# Patient Record
Sex: Male | Born: 1940 | Race: Black or African American | Hispanic: No | Marital: Married
Health system: Southern US, Community
[De-identification: ages and names within clinical notes are randomized; demographics above are authoritative.]

## PROBLEM LIST (undated history)

## (undated) DIAGNOSIS — I255 Ischemic cardiomyopathy: Secondary | ICD-10-CM

## (undated) DIAGNOSIS — I739 Peripheral vascular disease, unspecified: Secondary | ICD-10-CM

## (undated) DIAGNOSIS — I509 Heart failure, unspecified: Secondary | ICD-10-CM

## (undated) DIAGNOSIS — T8743 Infection of amputation stump, right lower extremity: Secondary | ICD-10-CM

## (undated) DIAGNOSIS — N184 Chronic kidney disease, stage 4 (severe): Secondary | ICD-10-CM

## (undated) DIAGNOSIS — E119 Type 2 diabetes mellitus without complications: Secondary | ICD-10-CM

---

## 2002-11-21 ENCOUNTER — Ambulatory Visit: Payer: Self-pay

## 2014-03-13 ENCOUNTER — Encounter (HOSPITAL_BASED_OUTPATIENT_CLINIC_OR_DEPARTMENT_OTHER): Payer: Self-pay | Admitting: Physical Medicine & Rehabilitation

## 2014-03-13 ENCOUNTER — Ambulatory Visit (HOSPITAL_BASED_OUTPATIENT_CLINIC_OR_DEPARTMENT_OTHER): Payer: Medicare Other | Attending: Physical Medicine & Rehabilitation | Admitting: Physical Medicine & Rehabilitation

## 2014-03-13 VITALS — BP 166/90 | HR 71 | Temp 98.4°F | Resp 16 | Ht 74.0 in | Wt 205.0 lb

## 2014-03-13 DIAGNOSIS — Z89521 Acquired absence of right knee: Secondary | ICD-10-CM | POA: Insufficient documentation

## 2014-03-13 DIAGNOSIS — Z89519 Acquired absence of unspecified leg below knee: Secondary | ICD-10-CM | POA: Insufficient documentation

## 2014-03-13 DIAGNOSIS — Z89511 Acquired absence of right leg below knee: Secondary | ICD-10-CM

## 2014-03-13 DIAGNOSIS — I509 Heart failure, unspecified: Secondary | ICD-10-CM | POA: Insufficient documentation

## 2014-03-13 DIAGNOSIS — E119 Type 2 diabetes mellitus without complications: Secondary | ICD-10-CM | POA: Insufficient documentation

## 2014-03-13 DIAGNOSIS — N189 Chronic kidney disease, unspecified: Secondary | ICD-10-CM | POA: Insufficient documentation

## 2014-03-13 NOTE — Progress Notes (Signed)
ID: Michael Mora is a 73 year old year old male with R BKA since 08/2012, who presents for initial evaluation for amputation rehabilitation at the request of Dr. Silvio Clayman.    PCP: Dr. Silvio Clayman    CC: left hip pain, R BKA, poorly fitting prosthesis    History of Present Illness:  Michael Mora is a pleasant 73 y.o. Male who presents today with his son to discuss prosthetic options. 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 He states that he had a prosthetic leg since last year but it has never fit well. He was seeing [facility] in [place] and in another location but is not pleased with the results. He feels that his functional mobility would increase with a better fitting prosthesis. He feels that his current leg is too loose and that the foot moves too much. He feels quite unstable and rarely ambulates. He wears his prosthesis all day but mostly it just enables him to transfer from a couch or bed to his wheelchair. He denies residual limb or phantom limb pain. Of note, he has a history of DM and CHF and has open heart/valve surgery planned in the upcoming months. He reports that he often has dyspnea on exertion with minimal ambulation around the house, or if he goes up/down stairs. He has a h/o diabetes and was seen by podiatry 2 weeks ago. He states that he had DM shoes ordered a long time ago but has not yet received them. He reports no current skin breakdown. ROS is negative for signs of infection including fever or chills. ROS is significant for poor vision and dyspnea on exertion.    Amputation history:  Side and level of amputation: R BKA  Date of amputation: around 08/2012  Cause of amputation: DM foot infection  Surgeon and facility where amputation performed: [facility] in [place]  Current prosthestist: none currently, was seeing [facility]  Description of current prosthesis: total contact below the knee socket with gel liner and  locking pin, exoskeleton pylon and dynamic response foot   Function prior to amputation: unlimited community ambulation without the use of assistive device  Current functional level: K2   Expected functional level with optimal prosthetic rehab: K3   Assistive devices used: spc and mwc     PMH:  DM  HTN  CAD  CHF  TIA  CKD  Cataract Surgery  Retina Surgery  BKA  PVD  RUE HD catheter  NSTEMI  Anemia    SH:  Lives with family in [place]. Ramp to enter home. BR, bathroom, kitchen, LR on first floor. Shower on second floor.    FH:  Family history of DM and CVA  Father with kidney disease    Meds/All/FH reviewed and updated in EPIC as appropriate     ROS: complete review of systems negative except as mentioned in the history above    OBJECTIVE:  VS: BP 166/90 mmHg  Pulse 71  Temp(Src) 98.4 F (36.9 C) (Temporal)  Resp 16  Ht 6' 2"  (1.88 m)  Wt 205 lb (92.987 kg)  BMI 26.31 kg/m2  GEN: Wt 205 lb (92.987 kg)      ROM: no hip or knee flexion contractures  Strength: strength intact in residual limb and intact limb  Residual limb:  Length: moderate  Shape: cylindrical  Skin: intact without rashes, ulcers or signs of infection  Intact foot:  The intact  foot has diminished sensation to light touch and absent to LT in toes, + pitting edema to mid shin, non-palpable dorsalis pedis pulse, dried skin between toes and 1cm callous on lateral edge of small toe.  Gait: slow cadence, unsteady, adducted swing with excessive internal rotation.    ASSESSMENT:      Mr. Villard is a 73 year old year old male with sub-optimal functional mobility after R BKA in 08/2012 who presents for initial evaluation for amputation rehabilitation.      Prosthetic plan:  The patient has the potential to be a K3 level ambulator with a well fitting and appropriate prosthesis.      Replacement socket: The patient requires a new socket because their current socket is no longer fitting due to volume change. The current socket has been maximally adjusted and  the patient is at high risk for skin breakdown and pain due to a poorly fitting socket.  This could lead to a decrease in mobility and independence with ADLs.  The patient met with one of the prosthetists today and has a follow up appointment scheduled in their clinic to be further fitted for a replacement socket and foot.    Replacement foot:  The patient requires a replacement foot as the current foot has too much motion and is unstable, placing him at risk for falls.  We recommend that they have a restricted motion foot to improve ambulation and increase function.      Physical Therapy Plan: The patient has never been seen by PT since amputation. We provided referral for PT which can be performed by a knowledgeable amputee therapist. We recommend [facility] in [place] given his home location.    Diabetes Plan:  The patient was counseled about routine foot care for their intact foot.  They were instructed to follow up with their podiatrist every 3 months or as directed by the podiatrist.  The patient will be referred to [facility] for diabetic shoe with custom insert.     Other: We have counseled the patient about the available services for amputees at [facility] including the amputee support group, rehab psychology services and vocational services.  We have discussed the high risk of falls after amputation and fall prevention strategies. He is also interested in power mobility- we provided him with a referral to our PT wheelchair clinic.    We have answered all of the patients questions and the patient agrees to the plan as described above.  We have provided the patient with information on how to contact the clinic for follow up appointments as needed.       Arna Snipe, DO  PGY-3 Rehab Medicine

## 2014-03-13 NOTE — Patient Instructions (Addendum)
Thank you for coming to [facility] today. We appreciate the time you spent with us today.     Our goal is to provide easy to understand instructions.    Reminders from your visit today:      1. Follow up with [facility] for adjustments to your prosthesis    2. Referral provided for [facility]. Please call them to schedule an appointment for prosthetic gait training.    3. Follow up with [facility] wheelchair clinic for wheelchair/power mobility assessment.      Have other questions? - Just let us know, we'd be happy to help. Please feel free to call us at:   [phone] or use the "email" feature in eCare/myChart.    We want your care experience to be excellent every time - if you receive a survey in the mail please fill it out and mail it back.    Dr. Eduard RouxFriedly appreciates the opportunity to work with you and your primary care provider to maximize your function and improve your health.

## 2014-03-13 NOTE — Progress Notes (Signed)
Call to [facility].  Request Medication list be faxed over.

## 2014-03-20 NOTE — Progress Notes (Signed)
I saw and evaluated the patient. I have reviewed the resident's documentation and agree with it.

## 2014-03-22 ENCOUNTER — Ambulatory Visit (HOSPITAL_BASED_OUTPATIENT_CLINIC_OR_DEPARTMENT_OTHER)
Payer: Medicare Other | Attending: Physical Medicine & Rehabilitation | Admitting: Rehabilitative and Restorative Service Providers"

## 2014-03-22 VITALS — BP 137/69 | HR 79

## 2014-03-22 DIAGNOSIS — N189 Chronic kidney disease, unspecified: Secondary | ICD-10-CM | POA: Insufficient documentation

## 2014-03-22 DIAGNOSIS — Z89511 Acquired absence of right leg below knee: Secondary | ICD-10-CM

## 2014-03-22 DIAGNOSIS — I509 Heart failure, unspecified: Secondary | ICD-10-CM | POA: Insufficient documentation

## 2014-03-22 DIAGNOSIS — E1159 Type 2 diabetes mellitus with other circulatory complications: Secondary | ICD-10-CM | POA: Insufficient documentation

## 2014-03-22 DIAGNOSIS — Z89521 Acquired absence of right knee: Secondary | ICD-10-CM | POA: Insufficient documentation

## 2014-03-22 DIAGNOSIS — Z7409 Other reduced mobility: Secondary | ICD-10-CM | POA: Insufficient documentation

## 2014-03-22 NOTE — Progress Notes (Signed)
[facility] INITIAL PLAN OF CARE          MOBILITY EQUIPMENT    __X_ Certification      From:  03/22/2014            Through:   03/22/2014  _X__ Discontinue Therapy Services    ONSET DATE:  08/2012   START OF CARE DATE:  03/22/2014  VISITS FROM SOC: 1  TOTAL DSHS UNITS TO DATE: 0  TREATMENT DIAGNOSIS: mobility impairment, balance impairment, decreased endurance    Referring Provider: Cindy Hazy, MD    Interpreter Status:  none  Cultural Practices that Influence Care: none  History of Present Illness: Increased difficulty with walking due to shortness of breath. Side and level of amputation: R BKA  Date of amputation: around 08/2012  Cause of amputation: DM foot infection  Surgeon and facility where amputation performed: [facility] in [place]  Current prosthestist: none currently, was seeing [facility]  Description of current prosthesis: total contact below the knee socket with gel liner and locking pin, exoskeleton pylon and dynamic response foot   Function prior to amputation: unlimited community ambulation without the use of assistive device  Current functional level: K2  Expected functional level with optimal prosthetic rehab: K3  Assistive devices used: spc and mwc  Pertinent Medical/Surgical History:    DM  HTN  CAD  CHF  TIA  CKD  Cataract Surgery  Retina Surgery  BKA  PVD  RUE HD catheter  NSTEMI  Anemia  L dislocated shoulder in the 1960's  B shoulder pain  Social History: Living independently in a house in [place] with one flight of stairs (railing on R going up) to get to the bathroom. Pt's son is around frequently to help him with groceries, ADL's, and pt's girlfriend is around frequently to help him get in and out of the bathtub for showers, during which he doffs his prosthetic and sits on a stool in the shower. Pt reports he is independent with all other ADL's and IADL's. 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  Prior Level of Function: Before onset of the current condition, the patient was able to walk independently    Current / Past Rehabilitation: SNF rehab immediately following receiving his prosthetic in the spring of 2014. Reports in home PT previously this year.  But not much focus on gait training  Precautions: R UE AV shunt for access for dialysis     SUBJECTIVE:  Patient's Statement: Pt reports that he gets SOB and cannot move around much anymore, because of his heart problems, and his prosthetic foot feeling like it is loose. Pt also c/o L stabbing hip pain during walking. Reports he can stand for about 10-15 minutes before having to sit down, and he gets really tired and out of breath entering and leaving the house to get to his truck.    Patient Stated Goals: To obtain a power wheelchair, increase endurance, and return to work.    OBJECTIVE:  Cognition: appropriate    Strength:   UE: 5/5 except shoulder flexion, abduction, ER all 3+/5   LE: 4/5 except hip flexion 3+/5    ROM:    UE: wnl except shoulder flexion 50% limited on L, 60% limited on R, shoulder abduction 75% limited B.  LE: wnl      Shoulder special tests:   Rotator  cuff: pos B   Impingement: Hawkins-Kennedy test Positive on L     Neer test Positive B     Shoulder external rotation 3+/5 minimal pain     Reverse Impingement test Positive on R    Posture: poor, slumped sacral sitting, forward head, forward shoulders, protracted and anteriorly tilted scapulae.    Transfers: sit to stand and stand to sit independent but guarded and slow     Ambulation: Walking over level with SPC in L UE, for 100 feet, displays L lateral trunk lean, R hip circumduction, wide BOS    Timed Up and Go: 24.47 seconds   Increased risk of falls with 13.5 seconds or more    Endurance: limited to walking up to 100 feet due to L hip pain and shortness of breath, standing 10-15 minutes due to pain/fatigue.    Wheelchair Mobility:    Manual wheelchair: Pt propelled a manual wheelchair with both hands, and feet if his arms get tired, and was able to sustain manual wheelchair propulsion for household level distances.    Power scooter:  A scooter is not appropriate for this patient due to his ability to propel a manual wheelchair.    Power wheelchair: A power wheelchair is not appropriate for this patient due to his ability to propel a manual wheelchair household distances independently.    CURRENT EQUIPMENT:   Wheelchair:  Left at home  [place] wheeled and four wheeled    10 Meter Walking Test:  Date   Self-paced:  03/22/14   19.46 sec = 33.3 meters/min = 43% of normal for 8 - 19 year old age group = least limited household ambulator    Fast-paced:   03/22/14 13.69 sec = 50 meters/ min = 52% of normal for 55 - 84 year old age group = least limited household ambulator       INTERVENTION:  Initial Evaluation x  60 Minutes    Time In: 1105  Total Intervention Minutes: 60  Total Timed Code Minutes: 0      Education:    Biomedical engineer: patient  Preferred learning style:  demonstration and verbal  Barriers to Learning: none  Topic Taught:  Evaluation results, importance of follow up with physician, prosthetist, and wheel chair company for any repairs that may be needed.  Response to Education: Patient and his son stated understanding      ASSESSMENT: Mr. Michael Mora presents for a physical therapy evaluation to determine appropriate mobility equipment needs.  The patient presents with impairments in strength, range of motion, endurance, balance, and pain.  Due to these impairments the patient's overall functional mobility is limited. Due to shortness of breath he is not able to independently or safely walk consistently in home with a walker or cane.  He is able to propel a manual wheelchair household distances independently, and therefore does not qualify for a power chair. Pt will seek further physical therapy services for improving  endurance and strength closer to his home in [place]. Pt instructed to keep in touch with his PCP, prosthetist, and wheelchair company regarding his heart issues, prosthetic issues, and w/c maintenance, respectively. Pt will be discharged today.    FALL RISK    yes   ?  Not a risk based on screening   ?   Low risk based on screening  X   High risk based on evaluation    Rehabilitation potential is: Good  Potential Barriers to achieve rehab goals:  none    Outcome Measure/Primary Functional Assessment Tool for G Coding:   10 meter walk test       Functional Limitation Medicare G CODE Medicare Modifier   Current Status  Mobility:  Walking  & Moving Around [id number]    CK   Projected Status Mobility:  Walking  & Moving Around   B2010 CK   Discharge Status Mobility: Walking & Moving Around [id number] CK       EQUIPMENT RECOMMENDATION:    Continue to use his manual wheelchair, and call the wheelchair company and or the nursing facility where he obtained the chair, in order to get necessary repairs to the chair. A power chair is not recommended due to his ability to propel his manual wheelchair in home distances, as well as his ability to ambulate inside the home.    GOALS:    Discharge Goals Progress Toward Discharge Goals/  Current Level of Function    03/22/2014   Appropriate wheelchair determined to meet patients' postural needs and mobility needs to maximize independence.  Met       The above goals and plan of care have been discussed and agreed upon by the patient.      PLAN:   Patient will be discharged from physical therapy at this time.  Pt to follow up with PT clinic in [place] to address endurance and gait limitations.    Danella Penton, SPT  Physical Therapy Intern  [facility]    I have reviewed the documentation of this visit and I confirm that I was present for the entire session and not engaged in other tasks.  I directed / guided the services and am responsible for the assessment and treatment of this  patient in this visit.      Kandee Keen, PT  Physical Therapist  [facility] [phone])  Department of Rehabilitation Medicine

## 2014-06-12 ENCOUNTER — Encounter (HOSPITAL_BASED_OUTPATIENT_CLINIC_OR_DEPARTMENT_OTHER): Payer: Medicare Other | Admitting: Physical Medicine & Rehabilitation

## 2015-10-02 ENCOUNTER — Inpatient Hospital Stay
Admission: AD | Admit: 2015-10-02 | Discharge: 2015-12-07 | Disposition: E | Payer: Self-pay | Source: Ambulatory Visit | Attending: Internal Medicine | Admitting: Internal Medicine

## 2015-10-02 ENCOUNTER — Institutional Professional Consult (permissible substitution) (HOSPITAL_COMMUNITY): Payer: Self-pay

## 2015-10-02 DIAGNOSIS — Z931 Gastrostomy status: Secondary | ICD-10-CM

## 2015-10-02 DIAGNOSIS — Z431 Encounter for attention to gastrostomy: Secondary | ICD-10-CM

## 2015-10-02 DIAGNOSIS — R569 Unspecified convulsions: Secondary | ICD-10-CM

## 2015-10-02 DIAGNOSIS — J9601 Acute respiratory failure with hypoxia: Secondary | ICD-10-CM | POA: Insufficient documentation

## 2015-10-02 DIAGNOSIS — K567 Ileus, unspecified: Secondary | ICD-10-CM

## 2015-10-02 DIAGNOSIS — Z978 Presence of other specified devices: Secondary | ICD-10-CM

## 2015-10-02 DIAGNOSIS — J9690 Respiratory failure, unspecified, unspecified whether with hypoxia or hypercapnia: Secondary | ICD-10-CM

## 2015-10-02 DIAGNOSIS — N184 Chronic kidney disease, stage 4 (severe): Secondary | ICD-10-CM | POA: Insufficient documentation

## 2015-10-02 DIAGNOSIS — J690 Pneumonitis due to inhalation of food and vomit: Secondary | ICD-10-CM | POA: Insufficient documentation

## 2015-10-02 DIAGNOSIS — Z4659 Encounter for fitting and adjustment of other gastrointestinal appliance and device: Secondary | ICD-10-CM

## 2015-10-02 DIAGNOSIS — G931 Anoxic brain damage, not elsewhere classified: Secondary | ICD-10-CM | POA: Insufficient documentation

## 2015-10-02 DIAGNOSIS — J189 Pneumonia, unspecified organism: Secondary | ICD-10-CM

## 2015-10-02 HISTORY — DX: Infection of amputation stump, right lower extremity: T87.43

## 2015-10-02 HISTORY — DX: Heart failure, unspecified: I50.9

## 2015-10-02 HISTORY — DX: Type 2 diabetes mellitus without complications: E11.9

## 2015-10-02 HISTORY — DX: Chronic kidney disease, stage 4 (severe): N18.4

## 2015-10-02 HISTORY — DX: Ischemic cardiomyopathy: I25.5

## 2015-10-02 HISTORY — DX: Peripheral vascular disease, unspecified: I73.9

## 2015-10-03 ENCOUNTER — Other Ambulatory Visit (HOSPITAL_COMMUNITY): Payer: Self-pay

## 2015-10-03 ENCOUNTER — Encounter: Payer: Self-pay | Admitting: Adult Health

## 2015-10-03 DIAGNOSIS — J9601 Acute respiratory failure with hypoxia: Secondary | ICD-10-CM | POA: Insufficient documentation

## 2015-10-03 DIAGNOSIS — N184 Chronic kidney disease, stage 4 (severe): Secondary | ICD-10-CM

## 2015-10-03 DIAGNOSIS — J690 Pneumonitis due to inhalation of food and vomit: Secondary | ICD-10-CM

## 2015-10-03 DIAGNOSIS — G931 Anoxic brain damage, not elsewhere classified: Secondary | ICD-10-CM | POA: Insufficient documentation

## 2015-10-03 LAB — CBC WITH DIFFERENTIAL/PLATELET
BASOS PCT: 0 %
Basophils Absolute: 0 10*3/uL (ref 0.0–0.1)
EOS ABS: 0 10*3/uL (ref 0.0–0.7)
Eosinophils Relative: 0 %
HEMATOCRIT: 28.7 % — AB (ref 39.0–52.0)
Hemoglobin: 8.9 g/dL — ABNORMAL LOW (ref 13.0–17.0)
LYMPHS ABS: 0.4 10*3/uL — AB (ref 0.7–4.0)
Lymphocytes Relative: 3 %
MCH: 25.1 pg — AB (ref 26.0–34.0)
MCHC: 31 g/dL (ref 30.0–36.0)
MCV: 80.8 fL (ref 78.0–100.0)
MONO ABS: 1.2 10*3/uL — AB (ref 0.1–1.0)
MONOS PCT: 9 %
Neutro Abs: 11.9 10*3/uL — ABNORMAL HIGH (ref 1.7–7.7)
Neutrophils Relative %: 88 %
Platelets: 176 10*3/uL (ref 150–400)
RBC: 3.55 MIL/uL — ABNORMAL LOW (ref 4.22–5.81)
RDW: 18.5 % — AB (ref 11.5–15.5)
WBC: 13.6 10*3/uL — ABNORMAL HIGH (ref 4.0–10.5)

## 2015-10-03 LAB — COMPREHENSIVE METABOLIC PANEL
ALK PHOS: 68 U/L (ref 38–126)
ALT: 13 U/L — AB (ref 17–63)
AST: 28 U/L (ref 15–41)
Albumin: 2.3 g/dL — ABNORMAL LOW (ref 3.5–5.0)
Anion gap: 16 — ABNORMAL HIGH (ref 5–15)
BILIRUBIN TOTAL: 0.9 mg/dL (ref 0.3–1.2)
BUN: 151 mg/dL — AB (ref 6–20)
CO2: 26 mmol/L (ref 22–32)
Calcium: 8.7 mg/dL — ABNORMAL LOW (ref 8.9–10.3)
Chloride: 98 mmol/L — ABNORMAL LOW (ref 101–111)
Creatinine, Ser: 5.18 mg/dL — ABNORMAL HIGH (ref 0.61–1.24)
GFR calc Af Amer: 11 mL/min — ABNORMAL LOW (ref >60)
GFR, EST NON AFRICAN AMERICAN: 10 mL/min — AB (ref >60)
Glucose, Bld: 281 mg/dL — ABNORMAL HIGH (ref 65–99)
Potassium: 4.2 mmol/L (ref 3.5–5.1)
Sodium: 140 mmol/L (ref 135–145)
TOTAL PROTEIN: 6.6 g/dL (ref 6.5–8.1)

## 2015-10-03 LAB — PHOSPHORUS: Phosphorus: 6.5 mg/dL — ABNORMAL HIGH (ref 2.5–4.6)

## 2015-10-03 LAB — MAGNESIUM: MAGNESIUM: 2.5 mg/dL — AB (ref 1.7–2.4)

## 2015-10-03 LAB — TSH: TSH: 1.423 u[IU]/mL (ref 0.350–4.500)

## 2015-10-03 NOTE — Consult Note (Signed)
Name: Robert Patrick MRN: [id number] DOB: 04/27/1941    ADMISSION DATE:  2015-10-27 CONSULTATION DATE:  4/27  REFERRING MD :  Sharyon Medicus  CHIEF COMPLAINT:  Vent management   BRIEF PATIENT DESCRIPTION: 75yo male with hx CHF, Ischemic cardiomyopathy (EF25%), AFib, CKD IV admitted to outside hospital with acute respiratory failure r/t aspiration PNA.  Course was c/b PEA arrest and subsequent anoxic brain injury.  He was tx 4/27 to [facility] for further vent weaning efforts.  Remains orally intubated, poor neurologic prognosis, deemed poor candidate for HD by nephrology.   SIGNIFICANT EVENTS  4/21 admitted for SOB 4/22 pt arrested. Anoxic injury. Intubated 4/26 pt transferred to [facility] 4/27 PCCM consulted   STUDIES:     HISTORY OF PRESENT ILLNESS:  75yo male with hx CHF, Ischemic cardiomyopathy (EF25%), AFib, CKD IV admitted to outside hospital with acute respiratory failure r/t aspiration PNA.  Course was c/b PEA arrest and subsequent anoxic brain injury.  He was tx 4/27 to [facility] for further vent weaning efforts.  Remains orally intubated, poor neurologic prognosis, deemed poor candidate for HD by nephrology.   PAST MEDICAL HISTORY :   has a past medical history of CHF (congestive heart failure) (HCC); Ischemic cardiomyopathy; CKD (chronic kidney disease), stage IV (HCC); Diabetes (HCC); PAD (peripheral artery disease) (HCC); and Right BKA infection (HCC).  has no past surgical history on file. Prior to Admission medications   Not on File   Allergies not on file  FAMILY HISTORY:  family history is not on file. SOCIAL HISTORY:    REVIEW OF SYSTEMS:   Unable, pt minimally responsive on vent   SUBJECTIVE:   VITAL SIGNS:   Reviewed at bedside, abnormal values discussed in a/p.   PHYSICAL EXAMINATION: General:  Thin, chronically ill appearing male, NAD  Neuro:  Unresponsive off all sedation, pupils 3mm sluggish  HEENT:  Mm moist, no JVD    Cardiovascular:  s1s2 irreg  Lungs:  resps even non labored on vent, oral ETT Abdomen:  Soft, +bs  Musculoskeletal:  Warm and dry, R BKA, no edema    Recent Labs Lab 10/03/15 0544  NA 140  K 4.2  CL 98*  CO2 26  BUN 151*  CREATININE 5.18*  GLUCOSE 281*    Recent Labs Lab 10/03/15 0544  HGB 8.9*  HCT 28.7*  WBC 13.6*  PLT 176   Dg Chest Port 1 View  2015-10-27  CLINICAL DATA:  Endotracheal tube placement. EXAM: PORTABLE CHEST 1 VIEW COMPARISON:  None. FINDINGS: Endotracheal tube terminates 6.5 cm above carina.Nasogastric tube is poorly visualized distally but terminates over the gastric body on recent abdominal radiograph. Numerous leads and wires project over the chest. Prior median sternotomy. Left internal jugular line is poorly visualized centrally. May terminate over the right-sided mediastinum. Cardiomegaly accentuated by AP portable technique. Small bilateral pleural effusions. No pneumothorax. Interstitial edema is worse on the right. Basilar predominant airspace disease is worse on the right. IMPRESSION: Appropriate position of endotracheal tube, 6.5 cm above carina. Otherwise, limited exam secondary to AP portable technique and numerous wires and leads projecting over the chest. A left internal jugular line is poorly visualized centrally. Consider repeat radiographs after removal of the EKG leads and wires. Congestive heart failure with bilateral pleural effusions and basilar predominant airspace disease. Electronically Signed   By: Jeronimo Greaves M.D.   On: 27-Oct-2015 21:40   Dg Abd Portable 1v  10/27/2015  CLINICAL DATA:  Encounter for nasogastric tube placement. EXAM: PORTABLE  ABDOMEN - 1 VIEW COMPARISON:  None. FINDINGS: The bowel gas pattern is normal. Distal tip of nasogastric tube is seen in proximal stomach. No radio-opaque calculi or other significant radiographic abnormality are seen. IMPRESSION: Distal tip of nasogastric tube seen in proximal stomach. Electronically  Signed   By: Lupita RaiderJames  Green Jr, M.D.   On: Jul 12, 2015 21:17    ASSESSMENT / PLAN:  Acute respiratory failure - r/t aspiration PNA initially, now c/b PEA arrest with subsequent anoxic injury.  Remains orally intubated, vent dependent.  Unable to wean/extubate r/t poor mental status.   PLAN -  -Vent support  -intermittent f/u CXR -abx per primary -no weaning trials unless significant neurologic improvement  -consider EEG, CT head  -Would strongly encourage ongoing family discussions regarding goals of care given poor neurologic prognosis as well as CKD not suitable candidate for long term HD.  If family continues to desire aggressive care pt would need ENT trach for long term vent support   CKD IV  Ischemic cardiomyopathy - EF 25% -- previously deemed not AICD candidate r/t poor compliance  DM  AFib  [facility]  Dirk DressKaty Whiteheart, NP 10/03/2015  2:50 PM Pager: [phone] or [phone]) [phone]  ATTENDING NOTE / ATTESTATION NOTE :   I have discussed the case with the resident/APP  Dirk DressKaty Whiteheart.   I agree with the resident/APP's  history, physical examination, assessment, and plans.  I have edited the above note and modified it according to our agreed history, physical examination, assessment and plan.    Family :No family at bedside. Suggest discuss goals of care with family.    [name], MD 10/03/2015, 4:19 PM [facility] Pager [phone] After 3 pm or if no answer, call [phone]

## 2015-10-04 NOTE — Consult Note (Signed)
[facility] CONSULT NOTE    Date: 10/04/2015                  Patient Name:  Robert Patrick  MRN: [id number]  DOB: 12/08/1940  Age / Sex: 75 y.o., male         PCP: Pcp Not In System                 Service Requesting Consult: Dr. Sharyon MedicusHijazi                 Reason for Consult: CKD stage IV/Acute renal failure            History of Present Illness: Patient is a 75 y.o. 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  Patient unable to offer any history. He has an endotracheal tube in place as well as an OG tube. He was having active seizures this a.m. As above he had recent PEA arrest with subsequent anoxic brain injury. He was apparently followed by nephrology at the outside hospital as well. He was deemed to be a 4 candidate for long-term hemodialysis. He has a right upper extremity AV access in place. He did not have dialysis at the outside hospital. His BUN is currently up to 151 and creatinine is currently 5.1. We were asked to consider him for dialysis services. Dr. Sharyon MedicusHijazi had discussion with the family and they were opened to a time-limited trial of renal placement therapy to see if this would improve his underlying status.   Medications:   Current medications: Levofloxacin 750 mg IV every 48 hours, Humalog insulin, Lipitor 20 mg each bedtime, Coreg 3.125 mg every 6 hours, Pepcid 20 mg daily, fish oil 8000 mg daily, folic acid 2 mg daily, ipratropium/albuterol 3 cc inhaled every 6 hours, Lantus insulin 10 units subcutaneous daily, Synthroid 0.05 mg daily, modafinil 200 mg daily, Zosyn 3.375 g IV twice a day, prednisone 60 mg daily, senna 2 tablets each bedtime,  Flomax 0.4 mg each bedtime    Allergies: anticoagulants   Past Medical History: Past Medical History  Diagnosis Date  . CHF (congestive heart failure) (HCC)   . Ischemic cardiomyopathy     EF 25%  . CKD (chronic kidney disease), stage IV (HCC)   . Diabetes (HCC)   . PAD (peripheral artery disease) (HCC)   . Right BKA infection (HCC)      Past Surgical History: rigth lower extremity amputation  Family History: Unable to obtain from patient as he's unresponsive.  Social History:  Unable to obtain from patient as he's unresponsive.  Review of Systems: Unable to obtain from patient as he's unresponsive.  Vital Signs: Temperature97.6 pulse 82 respirations 22 blood pressure 128/69 pulse ox 100% Weight trends: There were no vitals filed for this visit.  Physical Exam: General: Critically ill appearing  Head: Normocephalic, atraumatic. ETT and OG in palce  Eyes: Irregular left pupil, nonreactive right pupil  Nose: Mucous membranes moist, not inflammed, nonerythematous.  Throat: ETT/OG in place  Neck: Supple, trachea midline.  Lungs:  Bilateral rhonchi, vent assisted.  Heart: S1S2 no obvious rub but difficult to assess  Abdomen:  BS normoactive. Soft, Nondistended, non-tender.  No masses or organomegaly.  Extremities: RLE amputation, no edema  Neurologic: Obtunded, not following any commands  Skin: No visible rashes, scars.    Lab results: Basic Metabolic Panel:  Recent Labs Lab 10/03/15 0544  NA 140  K 4.2  CL 98*  CO2 26  GLUCOSE 281*  BUN 151*  CREATININE 5.18*  CALCIUM 8.7*  MG 2.5*  PHOS 6.5*    Liver Function Tests:  Recent Labs Lab 10/03/15 0544  AST 28  ALT 13*  ALKPHOS 68  BILITOT 0.9  PROT 6.6  ALBUMIN 2.3*   No results for input(s): LIPASE, AMYLASE in the last 168 hours. No results for input(s): AMMONIA in the last 168 hours.  CBC:  Recent Labs Lab 10/03/15 0544  WBC 13.6*  NEUTROABS 11.9*  HGB 8.9*  HCT 28.7*  MCV 80.8   PLT 176    Cardiac Enzymes: No results for input(s): CKTOTAL, CKMB, CKMBINDEX, TROPONINI in the last 168 hours.  BNP: Invalid input(s): POCBNP  CBG: No results for input(s): GLUCAP in the last 168 hours.  Microbiology: No results found for this or any previous visit.  Coagulation Studies: No results for input(s): LABPROT, INR in the last 72 hours.  Urinalysis: No results for input(s): COLORURINE, LABSPEC, PHURINE, GLUCOSEU, HGBUR, BILIRUBINUR, KETONESUR, PROTEINUR, UROBILINOGEN, NITRITE, LEUKOCYTESUR in the last 72 hours.  Invalid input(s): APPERANCEUR    Imaging: Dg Chest Port 1 View  10/05/2015  CLINICAL DATA:  Endotracheal tube placement. EXAM: PORTABLE CHEST 1 VIEW COMPARISON:  None. FINDINGS: Endotracheal tube terminates 6.5 cm above carina.Nasogastric tube is poorly visualized distally but terminates over the gastric body on recent abdominal radiograph. Numerous leads and wires project over the chest. Prior median sternotomy. Left internal jugular line is poorly visualized centrally. May terminate over the right-sided mediastinum. Cardiomegaly accentuated by AP portable technique. Small bilateral pleural effusions. No pneumothorax. Interstitial edema is worse on the right. Basilar predominant airspace disease is worse on the right. IMPRESSION: Appropriate position of endotracheal tube, 6.5 cm above carina. Otherwise, limited exam secondary to AP portable technique and numerous wires and leads projecting over the chest. A left internal jugular line is poorly visualized centrally. Consider repeat radiographs after removal of the EKG leads and wires. Congestive heart failure with bilateral pleural effusions and basilar predominant airspace disease. Electronically Signed   By: Jeronimo Greaves M.D.   On: 09/10/2015 21:40   Dg Abd Portable 1v  10/03/2015  CLINICAL DATA:  Evaluate for ileus.  Followup exam P EXAM: PORTABLE ABDOMEN - 1 VIEW COMPARISON:  The 09/12/2015 FINDINGS: There is no  significant bowel dilation. No evidence obstruction or significant adynamic ileus. Soft tissues are poorly defined. Oral/nasogastric tube tip projects in the proximal to mid stomach, stable. IMPRESSION: No evidence of bowel obstruction or significant adynamic ileus. Electronically Signed   By: Amie Portland M.D.   On: 10/03/2015 18:23   Dg Abd Portable 1v  09/19/2015  CLINICAL DATA:  Encounter for nasogastric tube placement. EXAM: PORTABLE ABDOMEN - 1 VIEW COMPARISON:  None. FINDINGS: The bowel gas pattern is normal. Distal tip of nasogastric tube is seen in proximal stomach. No radio-opaque calculi or other significant radiographic abnormality are seen. IMPRESSION: Distal tip of nasogastric tube seen in proximal stomach. Electronically Signed   By: Lupita Raider, M.D.   On: 09/07/2015 21:17      Assessment & Plan: Pt is a 75 y.o. 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   1.  Acute renal failure. 2.  CKD stage IV. 3.  Hx of RUE AV access 4.  Respiratory failure. 5.  Anemia of CKD. 6.  Secondary hyperparathyroidism. 7.  Recent PEA arrest with anoxic brain injury. 8.  Seizure disorder noted 10/04/15.  Plan:  The patient is critically ill at this point in time. He had recent PEA arrest with subsequent anoxic brain injury. He has history of advanced chronic kidney disease stage IV and already had right upper extremity AV access in place. However he was deemed to be a poor candidate for long-term dialysis per the nephrologist at the outside hospital.  However Dr. Sharyon Medicus had a conversation with the patient's family and they were willing to consider time-limited trial of renal replacement  therapy to see if this would improve his condition. However we are hesitant to start dialysis at this moment as he just had a seizure episode. Further of electrolyte shifts with dialysis today could potentially worsen or exacerbate this issue. Therefore we recommended that we hold off on dialysis at this time and reconsider early next week depending upon the patient's status. For now continue supportive care as you are doing. Overall prognosis quite poor.

## 2015-10-05 ENCOUNTER — Other Ambulatory Visit (HOSPITAL_COMMUNITY): Payer: Self-pay

## 2015-10-07 NOTE — Progress Notes (Signed)
Name: Robert Patrick MRN: [id number] DOB: [birth year]    ADMISSION DATE:  09/09/2015 CONSULTATION DATE:  4/27  REFERRING MD :  Sharyon Medicus  CHIEF COMPLAINT:  Vent management   BRIEF PATIENT DESCRIPTION: 75yo male with hx CHF, Ischemic cardiomyopathy (EF25%), AFib, CKD IV admitted to outside hospital with acute respiratory failure r/t aspiration PNA.  Course was c/b PEA arrest and subsequent anoxic brain injury.  He was tx 4/27 to [facility] for further vent weaning efforts.  Remains orally intubated, poor neurologic prognosis, deemed poor candidate for HD by nephrology.    SIGNIFICANT EVENTS  4/21  admitted for SOB 4/22  pt arrested. Anoxic injury. Intubated 4/26  [facility] 4/27  PCCM consulted   STUDIES:    SUBJECTIVE:   VITAL SIGNS: 97.9, 79, 18, 140/71, 99% on 28% FiO2  PHYSICAL EXAMINATION: General:  Thin, chronically ill appearing male, NAD on vent Neuro:  Unresponsive off all sedation, no response to verbal or painful stimuli  HEENT:  Mm moist, no JVD  Cardiovascular:  s1s2 irreg  Lungs:  resps even non labored on vent, oral ETT, lungs clear bilaterally, diminished lower Abdomen:  Soft, +bs  Musculoskeletal:  Warm and dry, R BKA, no edema    Recent Labs Lab 10/03/15 0544  NA 140  K 4.2  CL 98*  CO2 26  BUN 151*  CREATININE 5.18*  GLUCOSE 281*    Recent Labs Lab 10/03/15 0544  HGB 8.9*  HCT 28.7*  WBC 13.6*  PLT 176   Ct Head Wo Contrast  10/05/2015  CLINICAL DATA:  75 year old male with history of dysphagia. EXAM: CT HEAD WITHOUT CONTRAST TECHNIQUE: Contiguous axial images were obtained from the base of the skull through the vertex without intravenous contrast. COMPARISON:  No priors. FINDINGS: Mild cerebral atrophy. Patchy and confluent areas of decreased attenuation are noted throughout the deep and periventricular white matter of the cerebral hemispheres bilaterally, compatible with chronic microvascular ischemic disease.  Physiologic calcifications of the basal ganglia bilaterally. Tiny low-attenuation foci in the basal ganglia bilaterally, compatible with old lacunar infarcts. No acute intracranial abnormalities. Specifically, no evidence of acute intracranial hemorrhage, no definite findings of acute/subacute cerebral ischemia, no mass, mass effect, hydrocephalus or abnormal intra or extra-axial fluid collections. Visualized paranasal sinuses are remarkable for multifocal mucosal thickening predominantly in the maxillary and ethmoid sinuses, as well some small air-fluid levels in the sphenoid sinuses. High attenuation in the left globe, presumably from prior injection. No acute displaced skull fractures are identified. IMPRESSION: 1. No acute intracranial abnormalities. 2. Paranasal sinus disease, including air-fluid levels in the sphenoid sinus, concerning for acute sinusitis. 3. Mild cerebral atrophy with chronic microvascular ischemic changes in cerebral white matter and old bilateral basal ganglia lacunar infarcts, as above. Electronically Signed   By: Trudie Reed M.D.   On: 10/05/2015 17:34    ASSESSMENT / PLAN:  Acute respiratory failure - r/t aspiration PNA initially, now c/b PEA arrest with subsequent anoxic injury.  Remains orally intubated, vent dependent.  Unable to wean/extubate r/t poor mental status.   Bilateral Pleural Effusions  PLAN: -Vent support, ACVC 500/22/5/28% -intermittent f/u CXR -abx per primary -no weaning trials unless significant neurologic improvement  -consider EEG, CT head  -Would strongly encourage ongoing family discussions regarding goals of care given poor neurologic prognosis as well as CKD not suitable candidate for long term HD.  If family continues to desire aggressive care pt would need ENT trach for long term vent support  CKD IV  Ischemic cardiomyopathy - EF 25% -- previously deemed not AICD candidate r/t poor compliance  DM  AFib   [facility]  [facility]   Family:  No family at bedside. Suggest discuss goals of care with family.   Canary BrimBrandi Armanie Ullmer, NP-C [facility] Pgr: [phone] or if no answer [phone] 10/07/2015, 3:25 PM

## 2015-10-07 NOTE — Progress Notes (Signed)
  Subjective:   Patient remains critically ill, intubated and sedated No new labs Most recent labs are from April 27 as noted below BUN/creatinine Levels are critically high   Objective:  Vital signs in last 24 hours:   temperature 97.9, pulse 82, respirations 22, blood pressure 143/76. Urine output 1325 cc Ventilator. FiO2 28%/PEEP of 5  Weight change:  There were no vitals filed for this visit.  Intake/Output:   No intake or output data in the 24 hours ending 10/07/15 1741   Physical Exam: General: Critically ill-appearing  HEENT ET tube, OGT  Neck No masses  Pulm/lungs Ventilator dependent,  CVS/Heart Irregular, A Fib  Abdomen:  Soft, nondistended  Extremities: + peripheral edema  Neurologic: Not following commands  Skin: Decreased turgor  Access: Right arm AV graft       Basic Metabolic Panel:   Recent Labs Lab 10/03/15 0544  NA 140  K 4.2  CL 98*  CO2 26  GLUCOSE 281*  BUN 151*  CREATININE 5.18*  CALCIUM 8.7*  MG 2.5*  PHOS 6.5*     CBC:  Recent Labs Lab 10/03/15 0544  WBC 13.6*  NEUTROABS 11.9*  HGB 8.9*  HCT 28.7*  MCV 80.8  PLT 176      Microbiology:  No results found for this or any previous visit (from the past 720 hour(s)).  Coagulation Studies: No results for input(s): LABPROT, INR in the last 72 hours.  Urinalysis: No results for input(s): COLORURINE, LABSPEC, PHURINE, GLUCOSEU, HGBUR, BILIRUBINUR, KETONESUR, PROTEINUR, UROBILINOGEN, NITRITE, LEUKOCYTESUR in the last 72 hours.  Invalid input(s): APPERANCEUR    Imaging: No results found.   Medications:       Assessment/ Plan:  75 y.o. 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   1. Acute renal failure. Hx of RUE AV access 2. CKD stage IV. 3. Recent PEA arrest with anoxic brain injury. 4. Acute Respiratory failure. 5. Anemia of CKD. 6. Secondary hyperparathyroidism. 7. Seizure disorder noted 10/04/15.  Plan: This is a difficult situation. We have been asked to dialyze the patient is to rule out uremia as the cause of altered mental status. As per previous discussions with family, per previous notes, patient's family wants to try a time limited trial of dialysis to see if it makes a difference in his mental status. In order to achieve that, we will plan to dialyze patient daily several days in a row Overall prognosis remains poor. Outpatient dialysis placement of this patient in current condition will be very difficult Will continue to follow     LOS:  Yaslin Kirtley 5/1/20175:41 PM

## 2015-10-07 DEATH — deceased

## 2015-10-08 ENCOUNTER — Other Ambulatory Visit (HOSPITAL_COMMUNITY): Payer: Self-pay

## 2015-10-08 LAB — RENAL FUNCTION PANEL
ALBUMIN: 2 g/dL — AB (ref 3.5–5.0)
ANION GAP: 13 (ref 5–15)
BUN: 159 mg/dL — ABNORMAL HIGH (ref 6–20)
CALCIUM: 8.9 mg/dL (ref 8.9–10.3)
CO2: 26 mmol/L (ref 22–32)
Chloride: 105 mmol/L (ref 101–111)
Creatinine, Ser: 3.97 mg/dL — ABNORMAL HIGH (ref 0.61–1.24)
GFR calc Af Amer: 16 mL/min — ABNORMAL LOW (ref >60)
GFR calc non Af Amer: 14 mL/min — ABNORMAL LOW (ref >60)
GLUCOSE: 421 mg/dL — AB (ref 65–99)
PHOSPHORUS: 4.9 mg/dL — AB (ref 2.5–4.6)
Potassium: 4.4 mmol/L (ref 3.5–5.1)
SODIUM: 144 mmol/L (ref 135–145)

## 2015-10-08 LAB — CBC
HCT: 31.5 % — ABNORMAL LOW (ref 39.0–52.0)
HEMOGLOBIN: 9.9 g/dL — AB (ref 13.0–17.0)
MCH: 26.3 pg (ref 26.0–34.0)
MCHC: 31.4 g/dL (ref 30.0–36.0)
MCV: 83.8 fL (ref 78.0–100.0)
Platelets: 201 10*3/uL (ref 150–400)
RBC: 3.76 MIL/uL — AB (ref 4.22–5.81)
RDW: 18.9 % — ABNORMAL HIGH (ref 11.5–15.5)
WBC: 17.6 10*3/uL — AB (ref 4.0–10.5)

## 2015-10-09 LAB — RENAL FUNCTION PANEL
ALBUMIN: 2 g/dL — AB (ref 3.5–5.0)
ANION GAP: 14 (ref 5–15)
BUN: 136 mg/dL — ABNORMAL HIGH (ref 6–20)
CALCIUM: 8.7 mg/dL — AB (ref 8.9–10.3)
CO2: 25 mmol/L (ref 22–32)
Chloride: 103 mmol/L (ref 101–111)
Creatinine, Ser: 3.42 mg/dL — ABNORMAL HIGH (ref 0.61–1.24)
GFR, EST AFRICAN AMERICAN: 19 mL/min — AB (ref >60)
GFR, EST NON AFRICAN AMERICAN: 16 mL/min — AB (ref >60)
GLUCOSE: 405 mg/dL — AB (ref 65–99)
PHOSPHORUS: 4.6 mg/dL (ref 2.5–4.6)
POTASSIUM: 4.3 mmol/L (ref 3.5–5.1)
SODIUM: 142 mmol/L (ref 135–145)

## 2015-10-09 LAB — HEPATITIS B SURFACE ANTIGEN: HEP B S AG: NEGATIVE

## 2015-10-09 LAB — HEPATITIS B SURFACE ANTIBODY,QUALITATIVE: Hep B S Ab: NONREACTIVE

## 2015-10-09 LAB — HEPATITIS B CORE ANTIBODY, TOTAL: HEP B C TOTAL AB: NEGATIVE

## 2015-10-09 NOTE — Progress Notes (Signed)
  Subjective:   Patient remains critically ill, intubated and sedated Dialysis done 5/2, 5/3 BUN/creatinine Levels are critically high   Objective:  Vital signs in last 24 hours:   temperature 98.1, pulse 82, respirations 22, blood pressure 126/63.  Ventilator. FiO2 28%/PEEP of 5  Weight change:  There were no vitals filed for this visit.  Intake/Output:   No intake or output data in the 24 hours ending 10/09/15 1708   Physical Exam: General: Critically ill-appearing  HEENT ET tube, OGT  Neck No masses  Pulm/lungs Ventilator dependent,  CVS/Heart Irregular, A Fib  Abdomen:  Soft, nondistended  Extremities: + peripheral edema  Neurologic: Not following commands  Skin: Decreased turgor  Access: Right arm AV graft       Basic Metabolic Panel:   Recent Labs Lab 10/03/15 0544 10/08/15 0800 10/09/15 0522  NA 140 144 142  K 4.2 4.4 4.3  CL 98* 105 103  CO2 26 26 25   GLUCOSE 281* 421* 405*  BUN 151* 159* 136*  CREATININE 5.18* 3.97* 3.42*  CALCIUM 8.7* 8.9 8.7*  MG 2.5*  --   --   PHOS 6.5* 4.9* 4.6     CBC:  Recent Labs Lab 10/03/15 0544 10/08/15 0800  WBC 13.6* 17.6*  NEUTROABS 11.9*  --   HGB 8.9* 9.9*  HCT 28.7* 31.5*  MCV 80.8 83.8  PLT 176 201      Microbiology:  No results found for this or any previous visit (from the past 720 hour(s)).  Coagulation Studies: No results for input(s): LABPROT, INR in the last 72 hours.  Urinalysis: No results for input(s): COLORURINE, LABSPEC, PHURINE, GLUCOSEU, HGBUR, BILIRUBINUR, KETONESUR, PROTEINUR, UROBILINOGEN, NITRITE, LEUKOCYTESUR in the last 72 hours.  Invalid input(s): APPERANCEUR    Imaging: No results found.   Medications:       Assessment/ Plan:  75 y.o. 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   1. Acute renal failure. Hx of RUE AV access 2. CKD stage IV. 3. Recent PEA arrest with anoxic brain injury. 4. Acute Respiratory failure. 5. Anemia of CKD. 6. Secondary hyperparathyroidism. 7. Seizure disorder noted 10/04/15.  Plan: This is a difficult situation. We have been asked to dialyze the patient with to rule out uremia as the cause of altered mental status. As per previous discussions with family, per previous notes, patient's family wants a time limited trial of dialysis to see if it makes a difference in his mental status. In order to achieve that, we will plan to dialyze patient daily several days in a row Overall prognosis remains poor. Outpatient dialysis placement of this patient in current condition will be very difficult Will continue to follow HD 5/2, 5/3 Will do daily dialysis this week     LOS:  Robert Patrick 5/3/20175:08 PM

## 2015-10-10 ENCOUNTER — Other Ambulatory Visit (HOSPITAL_COMMUNITY): Payer: Self-pay

## 2015-10-10 LAB — RENAL FUNCTION PANEL
ANION GAP: 14 (ref 5–15)
Albumin: 2 g/dL — ABNORMAL LOW (ref 3.5–5.0)
BUN: 102 mg/dL — ABNORMAL HIGH (ref 6–20)
CHLORIDE: 103 mmol/L (ref 101–111)
CO2: 26 mmol/L (ref 22–32)
Calcium: 8.5 mg/dL — ABNORMAL LOW (ref 8.9–10.3)
Creatinine, Ser: 2.8 mg/dL — ABNORMAL HIGH (ref 0.61–1.24)
GFR calc Af Amer: 24 mL/min — ABNORMAL LOW (ref >60)
GFR calc non Af Amer: 21 mL/min — ABNORMAL LOW (ref >60)
GLUCOSE: 182 mg/dL — AB (ref 65–99)
PHOSPHORUS: 5 mg/dL — AB (ref 2.5–4.6)
POTASSIUM: 3.9 mmol/L (ref 3.5–5.1)
Sodium: 143 mmol/L (ref 135–145)

## 2015-10-10 LAB — CBC
HEMATOCRIT: 31.6 % — AB (ref 39.0–52.0)
HEMOGLOBIN: 9.5 g/dL — AB (ref 13.0–17.0)
MCH: 25.1 pg — AB (ref 26.0–34.0)
MCHC: 30.1 g/dL (ref 30.0–36.0)
MCV: 83.4 fL (ref 78.0–100.0)
Platelets: 211 10*3/uL (ref 150–400)
RBC: 3.79 MIL/uL — ABNORMAL LOW (ref 4.22–5.81)
RDW: 18.5 % — ABNORMAL HIGH (ref 11.5–15.5)
WBC: 16.6 10*3/uL — ABNORMAL HIGH (ref 4.0–10.5)

## 2015-10-10 NOTE — Progress Notes (Signed)
   Name: Robert Patrick MRN: [id number] DOB: [birth year]    ADMISSION DATE:  09/21/2015 CONSULTATION DATE:  4/27  REFERRING MD :  Sharyon MedicusHIjazi  CHIEF COMPLAINT:  Vent management   BRIEF PATIENT DESCRIPTION: 75yo male with hx CHF, Ischemic cardiomyopathy (EF25%), AFib, CKD IV admitted to outside hospital with acute respiratory failure r/t aspiration PNA.  Course was c/b PEA arrest and subsequent anoxic brain injury.  He was tx 4/27 to [facility] for further vent weaning efforts.  Remains orally intubated, poor neurologic prognosis, deemed poor candidate for HD by nephrology.    SIGNIFICANT EVENTS  4/21  admitted for SOB 4/22  pt arrested. Anoxic injury. Intubated 4/26  [facility] 4/27  PCCM consulted   STUDIES:  No acute events   SUBJECTIVE:   VITAL SIGNS: 97.9, 79, 18, 140/71, 99% on 28% FiO2  PHYSICAL EXAMINATION: General:  Thin, chronically ill appearing male, NAD on vent Neuro:  Does not withdraw to pain, no corneal reflex, will not withdraw to pain, no cough to suctioning; will open eyes spontaneously Pulmonary: vent supported breaths, normal air entry CV: RRR, no mgr GI: BS+, soft, MSK: diminished bulk and tone   Recent Labs Lab 10/08/15 0800 10/09/15 0522 10/10/15 0600  NA 144 142 143  K 4.4 4.3 3.9  CL 105 103 103  CO2 26 25 26   BUN 159* 136* 102*  CREATININE 3.97* 3.42* 2.80*  GLUCOSE 421* 405* 182*    Recent Labs Lab 10/08/15 0800 10/10/15 0600  HGB 9.9* 9.5*  HCT 31.5* 31.6*  WBC 17.6* 16.6*  PLT 201 211   Dg Chest Port 1 View  10/10/2015  CLINICAL DATA:  Respiratory failure. EXAM: PORTABLE CHEST 1 VIEW COMPARISON:  09/29/2015. FINDINGS: Endotracheal tube, NG tube, left IJ line stable position. Prior CABG. Cardiomegaly with partial interval clearing of bilateral pulmonary infiltrates and/or edema. Low lung volumes with basilar atelectasis . Bilateral pleural effusions remain. No pneumothorax. IMPRESSION: 1. Lines and tubes in  stable position. 2. Interim partial clearing of bilateral pulmonary infiltrates and/or edema. Residual diffuse interstitial edema remains. Low lung volumes with basilar atelectasis. Bilateral pleural effusions remain. 3. Prior CABG.  Persistent cardiomegaly. Electronically Signed   By: Maisie Fushomas  Register   On: 10/10/2015 07:46    ASSESSMENT / PLAN:  Acute respiratory failure - r/t aspiration PNA initially, now c/b PEA arrest with subsequent anoxic injury.  Remains orally intubated, vent dependent.  Unable to wean/extubate r/t poor mental status.   Bilateral Pleural Effusions  PLAN: -Vent support, ACVC 500/22/5/28% -intermittent f/u CXR -abx per primary -no weaning trials unless significant neurologic improvement    CKD IV  Ischemic cardiomyopathy - EF 25% -- previously deemed not AICD candidate r/t poor compliance  DM  AFib   PLAN: [facility] MD  Anoxic encephalopathy: Given his severe anoxic brain injury and comorbid illnesses, he will not survive this illness.  I have examined him twice now and though he has eye opening, he has no significant brainstem reflexes (no corneal, no gag).  Suspect he will never have neurologic recovery to have any meaningful quality of life.  Would not perform tracheostomy as this would provide no medical benefit.  Would continue conversations with family with palliative medicine.  I strongly recommend withdrawal of care.  Heber CarolinaBrent Wendee Hata, MD [facility] Pager: [phone] Cell: [phone] After 3pm or if no response, call [phone]

## 2015-10-11 LAB — RENAL FUNCTION PANEL
Albumin: 1.8 g/dL — ABNORMAL LOW (ref 3.5–5.0)
Anion gap: 16 — ABNORMAL HIGH (ref 5–15)
BUN: 88 mg/dL — AB (ref 6–20)
CALCIUM: 8.2 mg/dL — AB (ref 8.9–10.3)
CO2: 25 mmol/L (ref 22–32)
CREATININE: 2.32 mg/dL — AB (ref 0.61–1.24)
Chloride: 99 mmol/L — ABNORMAL LOW (ref 101–111)
GFR calc Af Amer: 30 mL/min — ABNORMAL LOW (ref >60)
GFR calc non Af Amer: 26 mL/min — ABNORMAL LOW (ref >60)
GLUCOSE: 349 mg/dL — AB (ref 65–99)
Phosphorus: 5.6 mg/dL — ABNORMAL HIGH (ref 2.5–4.6)
Potassium: 4.1 mmol/L (ref 3.5–5.1)
SODIUM: 140 mmol/L (ref 135–145)

## 2015-10-11 NOTE — Progress Notes (Signed)
Subjective:   Patient remains critically ill, intubated and sedated Dialysis done 5/2, 5/3, 5/4 BUN/creatinine Levels are critically high Patient seen during dialysis Tolerating well  BFR 300/DFR600   Objective:  Vital signs in last 24 hours:   temperature 98.2, pulse 81, respirations 27, blood pressure 121/69.  Ventilator. FiO2 28%/PEEP of 5  Weight change:  There were no vitals filed for this visit.  Intake/Output:   No intake or output data in the 24 hours ending 10/11/15 0929   Physical Exam: General: Critically ill-appearing  HEENT ET tube, OGT, eyes open  Neck No masses  Pulm/lungs Ventilator dependent,  CVS/Heart Irregular, A Fib  Abdomen:  Soft, nondistended  Extremities: + peripheral edema  Neurologic: Not following commands  Skin: Decreased turgor  Access: Right arm AV graft       Basic Metabolic Panel:   Recent Labs Lab 10/08/15 0800 10/09/15 0522 10/10/15 0600 10/11/15 0610  NA 144 142 143 140  K 4.4 4.3 3.9 4.1  CL 105 103 103 99*  CO2 GLUCOSE 421* 405* 182* 349*  BUN 159* 136* 102* 88*  CREATININE 3.97* 3.42* 2.80* 2.32*  CALCIUM 8.9 8.7* 8.5* 8.2*  PHOS 4.9* 4.6 5.0* 5.6*     CBC:  Recent Labs Lab 10/08/15 0800 10/10/15 0600  WBC 17.6* 16.6*  HGB 9.9* 9.5*  HCT 31.5* 31.6*  MCV 83.8 83.4  PLT 201 211      Microbiology:  No results found for this or any previous visit (from the past 720 hour(s)).  Coagulation Studies: No results for input(s): LABPROT, INR in the last 72 hours.  Urinalysis: No results for input(s): COLORURINE, LABSPEC, PHURINE, GLUCOSEU, HGBUR, BILIRUBINUR, KETONESUR, PROTEINUR, UROBILINOGEN, NITRITE, LEUKOCYTESUR in the last 72 hours.  Invalid input(s): APPERANCEUR    Imaging: Dg Chest Port 1 View  10/10/2015  CLINICAL DATA:  Respiratory failure. EXAM: PORTABLE CHEST 1 VIEW COMPARISON:  10/28/2015. FINDINGS: Endotracheal tube, NG tube, left IJ line stable position. Prior CABG.  Cardiomegaly with partial interval clearing of bilateral pulmonary infiltrates and/or edema. Low lung volumes with basilar atelectasis . Bilateral pleural effusions remain. No pneumothorax. IMPRESSION: 1. Lines and tubes in stable position. 2. Interim partial clearing of bilateral pulmonary infiltrates and/or edema. Residual diffuse interstitial edema remains. Low lung volumes with basilar atelectasis. Bilateral pleural effusions remain. 3. Prior CABG.  Persistent cardiomegaly. Electronically Signed   By: Maisie Fus  Register   On: 10/10/2015 07:46     Medications:       Assessment/ Plan:  75 y.o. 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   1. Acute renal failure. Hx of RUE AV access 2. CKD stage IV. 3. Recent PEA arrest with anoxic brain injury. 4. Acute Respiratory failure. 5. Anemia of CKD. 6. Secondary hyperparathyroidism. 7. Seizure disorder noted 10/04/15.  Plan: This is a difficult situation. We have been asked to dialyze the patient to rule out uremia as the cause of altered mental status.  Family requesting time limited trial of dialysis (2 weeks) to see if it improved mental status In order to achieve that, we will plan to dialyze patient daily several days in a row Overall prognosis remains poor. Outpatient dialysis placement of this patient in current condition will be  very difficult Will continue to follow HD 5/2, 5/3, 5/4, 5/5, 5/6 Will do daily dialysis this week Consider daily dialysis next week     LOS:  Robert Patrick 5/5/20179:29 AM

## 2015-10-12 LAB — CBC
HEMATOCRIT: 31.2 % — AB (ref 39.0–52.0)
HEMOGLOBIN: 9.5 g/dL — AB (ref 13.0–17.0)
MCH: 25.3 pg — AB (ref 26.0–34.0)
MCHC: 30.4 g/dL (ref 30.0–36.0)
MCV: 83.2 fL (ref 78.0–100.0)
Platelets: 195 10*3/uL (ref 150–400)
RBC: 3.75 MIL/uL — ABNORMAL LOW (ref 4.22–5.81)
RDW: 18.2 % — ABNORMAL HIGH (ref 11.5–15.5)
WBC: 15.9 10*3/uL — ABNORMAL HIGH (ref 4.0–10.5)

## 2015-10-12 LAB — RENAL FUNCTION PANEL
ANION GAP: 15 (ref 5–15)
Albumin: 1.9 g/dL — ABNORMAL LOW (ref 3.5–5.0)
BUN: 81 mg/dL — ABNORMAL HIGH (ref 6–20)
CHLORIDE: 98 mmol/L — AB (ref 101–111)
CO2: 24 mmol/L (ref 22–32)
Calcium: 8.2 mg/dL — ABNORMAL LOW (ref 8.9–10.3)
Creatinine, Ser: 2.23 mg/dL — ABNORMAL HIGH (ref 0.61–1.24)
GFR calc Af Amer: 32 mL/min — ABNORMAL LOW (ref >60)
GFR calc non Af Amer: 27 mL/min — ABNORMAL LOW (ref >60)
GLUCOSE: 290 mg/dL — AB (ref 65–99)
POTASSIUM: 3.8 mmol/L (ref 3.5–5.1)
Phosphorus: 4.5 mg/dL (ref 2.5–4.6)
SODIUM: 137 mmol/L (ref 135–145)

## 2015-10-14 LAB — CBC
HEMATOCRIT: 29 % — AB (ref 39.0–52.0)
Hemoglobin: 9 g/dL — ABNORMAL LOW (ref 13.0–17.0)
MCH: 25.2 pg — AB (ref 26.0–34.0)
MCHC: 31 g/dL (ref 30.0–36.0)
MCV: 81.2 fL (ref 78.0–100.0)
PLATELETS: 148 10*3/uL — AB (ref 150–400)
RBC: 3.57 MIL/uL — ABNORMAL LOW (ref 4.22–5.81)
RDW: 17.8 % — AB (ref 11.5–15.5)
WBC: 12.4 10*3/uL — ABNORMAL HIGH (ref 4.0–10.5)

## 2015-10-14 LAB — RENAL FUNCTION PANEL
Albumin: 1.9 g/dL — ABNORMAL LOW (ref 3.5–5.0)
Anion gap: 12 (ref 5–15)
BUN: 99 mg/dL — AB (ref 6–20)
CHLORIDE: 97 mmol/L — AB (ref 101–111)
CO2: 25 mmol/L (ref 22–32)
CREATININE: 2.38 mg/dL — AB (ref 0.61–1.24)
Calcium: 8 mg/dL — ABNORMAL LOW (ref 8.9–10.3)
GFR calc Af Amer: 29 mL/min — ABNORMAL LOW (ref >60)
GFR, EST NON AFRICAN AMERICAN: 25 mL/min — AB (ref >60)
Glucose, Bld: 158 mg/dL — ABNORMAL HIGH (ref 65–99)
POTASSIUM: 4.1 mmol/L (ref 3.5–5.1)
Phosphorus: 4.4 mg/dL (ref 2.5–4.6)
Sodium: 134 mmol/L — ABNORMAL LOW (ref 135–145)

## 2015-10-14 NOTE — Progress Notes (Signed)
  Subjective:  Patient has had consecutive days of dialysis. He completed hemodialysis today as well. Remains critically ill.   Objective:  Vital signs in last 24 hours:  98.1 pulse 97 respirations 29 blood pressure 126/64   Physical Exam: General: Critically ill-appearing  HEENT ET tube, OGT, eyes open  Neck supple  Pulm/lungs Ventilator dependent, bilateral rhonchi  CVS/Heart Irregular, A Fib  Abdomen:  Soft, nondistended, BS present  Extremities: + peripheral edema  Neurologic: Not following commands  Skin: No rashes  Access: Right arm AV graft       Basic Metabolic Panel:   Recent Labs Lab 10/09/15 0522 10/10/15 0600 10/11/15 0610 10/12/15 0545 10/14/15 0553  NA 142 143 140 137 134*  K 4.3 3.9 4.1 3.8 4.1  CL 103 103 99* 98* 97*  CO2 25 26 25 24 25   GLUCOSE 405* 182* 349* 290* 158*  BUN 136* 102* 88* 81* 99*  CREATININE 3.42* 2.80* 2.32* 2.23* 2.38*  CALCIUM 8.7* 8.5* 8.2* 8.2* 8.0*  PHOS 4.6 5.0* 5.6* 4.5 4.4     CBC:  Recent Labs Lab 10/08/15 0800 10/10/15 0600 10/12/15 0545 10/14/15 0553  WBC 17.6* 16.6* 15.9* 12.4*  HGB 9.9* 9.5* 9.5* 9.0*  HCT 31.5* 31.6* 31.2* 29.0*  MCV 83.8 83.4 83.2 81.2  PLT 201 211 195 148*      Microbiology:  No results found for this or any previous visit (from the past 720 hour(s)).  Coagulation Studies: No results for input(s): LABPROT, INR in the last 72 hours.  Urinalysis: No results for input(s): COLORURINE, LABSPEC, PHURINE, GLUCOSEU, HGBUR, BILIRUBINUR, KETONESUR, PROTEINUR, UROBILINOGEN, NITRITE, LEUKOCYTESUR in the last 72 hours.  Invalid input(s): APPERANCEUR    Imaging: No results found.   Medications:       Assessment/ Plan:  75 y.o. 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   1. Acute renal failure. Hx of RUE AV access, HD 5/2, 5/3, 5/4, 5/5, 5/6, 5/8 2. CKD stage IV. 3. Recent PEA arrest with anoxic brain injury. 4. Acute Respiratory failure. 5. Anemia of CKD. 6. Secondary hyperparathyroidism. 7. Seizure disorder noted 10/04/15.  Plan: The patient has had 6 LC treatments. No significant change in his mental status thus far. We will plan for daily dialysis again this week. He has completed all stable will plan for now since on Tuesday and Wednesday as well. He remains on the ventilator but appears to be stable on this at the moment.his metabolic parameters will hopefully continue to improve with dialysis. We will follow the case with you closely. Overall very guarded prognosis.     LOS:  Emmert Roethler 5/8/20174:04 PM

## 2015-10-14 NOTE — Progress Notes (Signed)
   Name: Robert Patrick MRN: [id number] DOB: [birth year]    ADMISSION DATE:  03/28/16 CONSULTATION DATE:  4/27  REFERRING MD :  Sharyon MedicusHIjazi  CHIEF COMPLAINT:  Vent management   BRIEF PATIENT DESCRIPTION: 75yo male with hx CHF, Ischemic cardiomyopathy (EF25%), AFib, CKD IV admitted to outside hospital with acute respiratory failure r/t aspiration PNA.  Course was c/b PEA arrest and subsequent anoxic brain injury.  He was tx 4/27 to [facility] for further vent weaning efforts.  Remains orally intubated, poor neurologic prognosis, deemed poor candidate for HD by nephrology.    SIGNIFICANT EVENTS  4/21  admitted for SOB 4/22  pt arrested. Anoxic injury. Intubated 4/26  [facility]  PCCM consulted   STUDIES:  No acute events   SUBJECTIVE:   VITAL SIGNS: 98, 85, 16, 125/68, 99% on 28% FiO2  PHYSICAL EXAMINATION: General:  Thin, chronically ill appearing male, NAD on vent Neuro:  Does not withdraw to pain, no corneal reflex, will not withdraw to pain, no cough to suctioning; will open eyes spontaneously but not to command Pulmonary: vent supported breaths, normal air entry CV: RRR, no mgr GI: BS+, soft, MSK: diminished bulk and tone   Recent Labs Lab 10/11/15 0610 10/12/15 0545 10/14/15 0553  NA 140 137 134*  K 4.1 3.8 4.1  CL 99* 98* 97*  CO2 25 24 25   BUN 88* 81* 99*  CREATININE 2.32* 2.23* 2.38*  GLUCOSE 349* 290* 158*    Recent Labs Lab 10/10/15 0600 10/12/15 0545 10/14/15 0553  HGB 9.5* 9.5* 9.0*  HCT 31.6* 31.2* 29.0*  WBC 16.6* 15.9* 12.4*  PLT 211 195 148*   I reviewed CXR myself, trach in good position  ASSESSMENT / PLAN:  Acute respiratory failure - r/t aspiration PNA initially, now c/b PEA arrest with subsequent anoxic injury.  Remains orally intubated, vent dependent.  Unable to wean/extubate r/t poor mental status.   Bilateral Pleural Effusions  PLAN: -PSS 12/5 for 4-8 hours today. -Janina Mayorach would be futile  care. -Intermittent f/u CXR -Abx per primary -No extubation given neuro status.  CKD IV  Ischemic cardiomyopathy - EF 25% -- previously deemed not AICD candidate r/t poor compliance  DM  AFib   Anoxic encephalopathy: Given his severe anoxic brain injury and comorbid illnesses, he will not survive this illness.  Patient's neuro exam is bismal at best.  Tracheostomy would be futile care and will not be provided in this case.  Would continue discussion with family as this is not a survivable situation.  Discussed with RT.  Alyson ReedyWesam G. Yacoub, M.D. [facility]. Pager: [phone]. After hours pager: [phone].

## 2015-10-15 LAB — BLOOD GAS, ARTERIAL
Acid-Base Excess: 3.9 mmol/L — ABNORMAL HIGH (ref 0.0–2.0)
BICARBONATE: 28 meq/L — AB (ref 20.0–24.0)
DRAWN BY: 129711
FIO2: 0.28
Mode: POSITIVE
O2 Saturation: 95.1 %
PEEP: 5 cmH2O
PH ART: 7.436 (ref 7.350–7.450)
Patient temperature: 98.6
Pressure support: 12 cmH2O
TCO2: 29.3 mmol/L (ref 0–100)
pCO2 arterial: 42.3 mmHg (ref 35.0–45.0)
pO2, Arterial: 76.6 mmHg — ABNORMAL LOW (ref 80.0–100.0)

## 2015-10-15 LAB — RENAL FUNCTION PANEL
Albumin: 2 g/dL — ABNORMAL LOW (ref 3.5–5.0)
Anion gap: 10 (ref 5–15)
BUN: 70 mg/dL — AB (ref 6–20)
CALCIUM: 8.2 mg/dL — AB (ref 8.9–10.3)
CO2: 29 mmol/L (ref 22–32)
CREATININE: 2.02 mg/dL — AB (ref 0.61–1.24)
Chloride: 101 mmol/L (ref 101–111)
GFR calc Af Amer: 36 mL/min — ABNORMAL LOW (ref >60)
GFR calc non Af Amer: 31 mL/min — ABNORMAL LOW (ref >60)
GLUCOSE: 111 mg/dL — AB (ref 65–99)
Phosphorus: 3.9 mg/dL (ref 2.5–4.6)
Potassium: 4.1 mmol/L (ref 3.5–5.1)
SODIUM: 140 mmol/L (ref 135–145)

## 2015-10-15 LAB — CBC
HCT: 33.4 % — ABNORMAL LOW (ref 39.0–52.0)
HEMOGLOBIN: 10.3 g/dL — AB (ref 13.0–17.0)
MCH: 26.3 pg (ref 26.0–34.0)
MCHC: 30.8 g/dL (ref 30.0–36.0)
MCV: 85.4 fL (ref 78.0–100.0)
PLATELETS: 174 10*3/uL (ref 150–400)
RBC: 3.91 MIL/uL — AB (ref 4.22–5.81)
RDW: 18.2 % — ABNORMAL HIGH (ref 11.5–15.5)
WBC: 12.7 10*3/uL — AB (ref 4.0–10.5)

## 2015-10-16 ENCOUNTER — Other Ambulatory Visit (HOSPITAL_COMMUNITY): Payer: Self-pay

## 2015-10-16 LAB — CBC
HEMATOCRIT: 29.5 % — AB (ref 39.0–52.0)
HEMOGLOBIN: 9.2 g/dL — AB (ref 13.0–17.0)
MCH: 26.1 pg (ref 26.0–34.0)
MCHC: 31.2 g/dL (ref 30.0–36.0)
MCV: 83.8 fL (ref 78.0–100.0)
Platelets: 160 10*3/uL (ref 150–400)
RBC: 3.52 MIL/uL — AB (ref 4.22–5.81)
RDW: 17.9 % — ABNORMAL HIGH (ref 11.5–15.5)
WBC: 11.7 10*3/uL — ABNORMAL HIGH (ref 4.0–10.5)

## 2015-10-16 LAB — RENAL FUNCTION PANEL
ANION GAP: 12 (ref 5–15)
Albumin: 1.9 g/dL — ABNORMAL LOW (ref 3.5–5.0)
BUN: 64 mg/dL — ABNORMAL HIGH (ref 6–20)
CALCIUM: 8.2 mg/dL — AB (ref 8.9–10.3)
CHLORIDE: 98 mmol/L — AB (ref 101–111)
CO2: 28 mmol/L (ref 22–32)
Creatinine, Ser: 1.9 mg/dL — ABNORMAL HIGH (ref 0.61–1.24)
GFR calc non Af Amer: 33 mL/min — ABNORMAL LOW (ref >60)
GFR, EST AFRICAN AMERICAN: 38 mL/min — AB (ref >60)
GLUCOSE: 104 mg/dL — AB (ref 65–99)
POTASSIUM: 3.8 mmol/L (ref 3.5–5.1)
Phosphorus: 3.6 mg/dL (ref 2.5–4.6)
Sodium: 138 mmol/L (ref 135–145)

## 2015-10-16 NOTE — Progress Notes (Signed)
  Subjective:  Patient remains on daily dialysis. He completed dialysis today with ultrafiltration target of 1.5 kg. Patient not responsive to sternal rub.  Objective:  Vital signs in last 24 hours:  Blood pressure 97.6 pulse 75 respiratory 6 blood pressure 1:15/74   Physical Exam: General: Critically ill-appearing  HEENT ET tube, OGT, eyes open  Neck supple  Pulm/lungs Ventilator dependent, bilateral rhonchi  CVS/Heart Irregular, A Fib  Abdomen:  Soft, nondistended, BS present  Extremities: + peripheral edema  Neurologic: Not following commands, no response to sternal rub  Skin: No rashes  Access: Right arm AV graft       Basic Metabolic Panel:   Recent Labs Lab 10/11/15 0610 10/12/15 0545 10/14/15 0553 10/15/15 0751 10/16/15 0620  NA 140 137 134* 140 138  K 4.1 3.8 4.1 4.1 3.8  CL 99* 98* 97* 101 98*  CO2 25 24 25 29 28   GLUCOSE 349* 290* 158* 111* 104*  BUN 88* 81* 99* 70* 64*  CREATININE 2.32* 2.23* 2.38* 2.02* 1.90*  CALCIUM 8.2* 8.2* 8.0* 8.2* 8.2*  PHOS 5.6* 4.5 4.4 3.9 3.6     CBC:  Recent Labs Lab 10/10/15 0600 10/12/15 0545 10/14/15 0553 10/15/15 0751 10/16/15 0620  WBC 16.6* 15.9* 12.4* 12.7* 11.7*  HGB 9.5* 9.5* 9.0* 10.3* 9.2*  HCT 31.6* 31.2* 29.0* 33.4* 29.5*  MCV 83.4 83.2 81.2 85.4 83.8  PLT 211 195 148* 174 160      Microbiology:  No results found for this or any previous visit (from the past 720 hour(s)).  Coagulation Studies: No results for input(s): LABPROT, INR in the last 72 hours.  Urinalysis: No results for input(s): COLORURINE, LABSPEC, PHURINE, GLUCOSEU, HGBUR, BILIRUBINUR, KETONESUR, PROTEINUR, UROBILINOGEN, NITRITE, LEUKOCYTESUR in the last 72 hours.  Invalid input(s): APPERANCEUR    Imaging: Dg Abd Portable 1v  10/16/2015  CLINICAL DATA:  Check NG placement EXAM: PORTABLE ABDOMEN - 1 VIEW COMPARISON:  10/03/2015 FINDINGS: Nasogastric catheter is noted within the stomach. Scattered large and small bowel gas is  noted. Degenerative changes of lumbar spine are seen. No acute abnormality is noted. IMPRESSION: Nasogastric catheter within the stomach. Electronically Signed   By: Alcide CleverMark  Lukens M.D.   On: 10/16/2015 11:25     Medications:       Assessment/ Plan:  75 y.o. 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   1. Acute renal failure. Hx of RUE AV access, HD 5/2, 5/3, 5/4, 5/5, 5/6, 5/8, 5/9, 5/10 2. CKD stage IV. 3. Recent PEA arrest with anoxic brain injury. 4. Acute Respiratory failure. 5. Anemia of CKD. 6. Secondary hyperparathyroidism. 7. Seizure disorder noted 10/04/15.  Plan: The patient has completed 8 hemodialysis treatments without any significant improvement in his underlying mental status. This may be his baseline mental status now given underlying anoxic brain injury. This was explained in depth to the patient's family. However we will complete 2 weeks of hemodialysis as previously discussed. If uremia were playing a role improvement should remain a difference in his mental status. For now continue supportive care. We will perform dialysis Thursday and Friday as well.    LOS:  Regine Christian 5/10/20173:53 PM

## 2015-10-17 LAB — RENAL FUNCTION PANEL
ALBUMIN: 1.8 g/dL — AB (ref 3.5–5.0)
Anion gap: 13 (ref 5–15)
BUN: 56 mg/dL — AB (ref 6–20)
CALCIUM: 8 mg/dL — AB (ref 8.9–10.3)
CHLORIDE: 97 mmol/L — AB (ref 101–111)
CO2: 26 mmol/L (ref 22–32)
CREATININE: 1.93 mg/dL — AB (ref 0.61–1.24)
GFR calc Af Amer: 38 mL/min — ABNORMAL LOW (ref >60)
GFR, EST NON AFRICAN AMERICAN: 33 mL/min — AB (ref >60)
Glucose, Bld: 265 mg/dL — ABNORMAL HIGH (ref 65–99)
Phosphorus: 3.8 mg/dL (ref 2.5–4.6)
Potassium: 4.1 mmol/L (ref 3.5–5.1)
SODIUM: 136 mmol/L (ref 135–145)

## 2015-10-17 LAB — CBC
HCT: 28.6 % — ABNORMAL LOW (ref 39.0–52.0)
Hemoglobin: 9 g/dL — ABNORMAL LOW (ref 13.0–17.0)
MCH: 26.3 pg (ref 26.0–34.0)
MCHC: 31.5 g/dL (ref 30.0–36.0)
MCV: 83.6 fL (ref 78.0–100.0)
PLATELETS: 153 10*3/uL (ref 150–400)
RBC: 3.42 MIL/uL — AB (ref 4.22–5.81)
RDW: 18 % — AB (ref 11.5–15.5)
WBC: 10.7 10*3/uL — AB (ref 4.0–10.5)

## 2015-10-17 NOTE — Progress Notes (Signed)
   Name: Robert Patrick MRN: [id number] DOB: 1941-01-11    ADMISSION DATE:  2016/02/20 CONSULTATION DATE:  4/27  REFERRING MD :  Sharyon MedicusHIjazi  CHIEF COMPLAINT:  Vent management   BRIEF PATIENT DESCRIPTION: 75yo male with hx CHF, Ischemic cardiomyopathy (EF25%), AFib, CKD IV admitted to outside hospital with acute respiratory failure r/t aspiration PNA.  Course was c/b PEA arrest and subsequent anoxic brain injury.  He was tx 4/27 to [facility] for further vent weaning efforts.  Remains orally intubated, poor neurologic prognosis, deemed poor candidate for HD by nephrology.    SIGNIFICANT EVENTS  4/21  admitted for SOB 4/22  pt arrested. Anoxic injury. Intubated 4/26  [facility]  PCCM consulted  SUBJECTIVE: No acute events  VITAL SIGNS: 99.2, 85, 14, 120/70, 99% on 28% FiO2  PHYSICAL EXAMINATION: General:  Thin, chronically ill appearing male, NAD on vent Neuro:  Does not withdraw to pain, no corneal reflex, will not withdraw to pain, no cough to suctioning; will open eyes spontaneously but not to command Pulmonary: vent supported breaths, normal air entry CV: RRR, no mgr GI: BS+, soft, MSK: diminished bulk and tone   Recent Labs Lab 10/15/15 0751 10/16/15 0620 10/17/15 0724  NA 140 138 136  K 4.1 3.8 4.1  CL 101 98* 97*  CO2 29 28 26   BUN 70* 64* 56*  CREATININE 2.02* 1.90* 1.93*  GLUCOSE 111* 104* 265*    Recent Labs Lab 10/15/15 0751 10/16/15 0620 10/17/15 0724  HGB 10.3* 9.2* 9.0*  HCT 33.4* 29.5* 28.6*  WBC 12.7* 11.7* 10.7*  PLT 174 160 153   I reviewed CXR myself, ETT in good position  ASSESSMENT / PLAN:  Acute respiratory failure - r/t aspiration PNA initially, now c/b PEA arrest with subsequent anoxic injury.  Remains orally intubated, vent dependent.  Unable to wean/extubate r/t poor mental status.   Bilateral Pleural Effusions  PLAN: -Hold weaning. -Janina Mayorach would be futile care. -Intermittent f/u CXR -Abx per  primary -No extubation given neuro status.  CKD IV  Ischemic cardiomyopathy - EF 25% -- previously deemed not AICD candidate r/t poor compliance  DM  AFib   Anoxic encephalopathy: Given his severe anoxic brain injury and comorbid illnesses, he will not survive this illness.  Patient's neuro exam is bismal at best.  Tracheostomy would be futile care and will not be provided in this case.  Would continue discussion with family as this is not a survivable situation.  Discussed with RT.  Alyson ReedyWesam G. Yacoub, M.D. [facility]. Pager: [phone]. After hours pager: [phone].

## 2015-10-18 LAB — RENAL FUNCTION PANEL
ALBUMIN: 1.7 g/dL — AB (ref 3.5–5.0)
Anion gap: 11 (ref 5–15)
BUN: 56 mg/dL — AB (ref 6–20)
CALCIUM: 8 mg/dL — AB (ref 8.9–10.3)
CO2: 26 mmol/L (ref 22–32)
CREATININE: 1.88 mg/dL — AB (ref 0.61–1.24)
Chloride: 95 mmol/L — ABNORMAL LOW (ref 101–111)
GFR calc Af Amer: 39 mL/min — ABNORMAL LOW (ref >60)
GFR, EST NON AFRICAN AMERICAN: 34 mL/min — AB (ref >60)
Glucose, Bld: 221 mg/dL — ABNORMAL HIGH (ref 65–99)
PHOSPHORUS: 3 mg/dL (ref 2.5–4.6)
POTASSIUM: 3.9 mmol/L (ref 3.5–5.1)
SODIUM: 132 mmol/L — AB (ref 135–145)

## 2015-10-18 LAB — CBC
HEMATOCRIT: 27.2 % — AB (ref 39.0–52.0)
Hemoglobin: 8.7 g/dL — ABNORMAL LOW (ref 13.0–17.0)
MCH: 26.7 pg (ref 26.0–34.0)
MCHC: 32 g/dL (ref 30.0–36.0)
MCV: 83.4 fL (ref 78.0–100.0)
Platelets: 142 10*3/uL — ABNORMAL LOW (ref 150–400)
RBC: 3.26 MIL/uL — ABNORMAL LOW (ref 4.22–5.81)
RDW: 17.6 % — AB (ref 11.5–15.5)
WBC: 11.1 10*3/uL — AB (ref 4.0–10.5)

## 2015-10-18 NOTE — Progress Notes (Signed)
Subjective:  Patient seen and evaluated during hemodialysis. He has received an appropriate time limited trial of hemodialysis. dialysis has not changed his mental status.  Objective:  Vital signs in last 24 hours:  Temp 97.6 pulse 84 respiratory fundal pressure 134/71   Physical Exam: General: Critically ill-appearing  HEENT ET tube, OGT, eyes closed  Neck supple  Pulm/lungs Ventilator dependent, bilateral rhonchi  CVS/Heart Irregular, A Fib  Abdomen:  Soft, nondistended, BS present  Extremities: + peripheral edema, RLE amputation noted  Neurologic: Not following commands, no response to sternal rub  Skin: No rashes  Access: Right arm AV graft       Basic Metabolic Panel:   Recent Labs Lab 10/14/15 0553 10/15/15 0751 10/16/15 0620 10/17/15 0724 10/18/15 0633  NA 134* 140 138 136 132*  K 4.1 4.1 3.8 4.1 3.9  CL 97* 101 98* 97* 95*  CO2 GLUCOSE 158* 111* 104* 265* 221*  BUN 99* 70* 64* 56* 56*  CREATININE 2.38* 2.02* 1.90* 1.93* 1.88*  CALCIUM 8.0* 8.2* 8.2* 8.0* 8.0*  PHOS 4.4 3.9 3.6 3.8 3.0     CBC:  Recent Labs Lab 10/14/15 0553 10/15/15 0751 10/16/15 0620 10/17/15 0724 10/18/15 0633  WBC 12.4* 12.7* 11.7* 10.7* 11.1*  HGB 9.0* 10.3* 9.2* 9.0* 8.7*  HCT 29.0* 33.4* 29.5* 28.6* 27.2*  MCV 81.2 85.4 83.8 83.6 83.4  PLT 148* 174 160 153 142*      Microbiology:  No results found for this or any previous visit (from the past 720 hour(s)).  Coagulation Studies: No results for input(s): LABPROT, INR in the last 72 hours.  Urinalysis: No results for input(s): COLORURINE, LABSPEC, PHURINE, GLUCOSEU, HGBUR, BILIRUBINUR, KETONESUR, PROTEINUR, UROBILINOGEN, NITRITE, LEUKOCYTESUR in the last 72 hours.  Invalid input(s): APPERANCEUR    Imaging: Dg Abd Portable 1v  10/16/2015  CLINICAL DATA:  Check NG placement EXAM: PORTABLE ABDOMEN - 1 VIEW COMPARISON:  10/03/2015 FINDINGS: Nasogastric catheter is noted within the stomach.  Scattered large and small bowel gas is noted. Degenerative changes of lumbar spine are seen. No acute abnormality is noted. IMPRESSION: Nasogastric catheter within the stomach. Electronically Signed   By: Alcide Clever M.D.   On: 10/16/2015 11:25     Medications:       Assessment/ Plan:  75 y.o. 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   1. Acute renal failure. Hx of RUE AV access, HD 5/2, 5/3, 5/4, 5/5, 5/6, 5/8, 5/9, 5/10, 5/11, 5/12 2. CKD stage IV. 3. Recent PEA arrest with anoxic brain injury. 4. Acute Respiratory failure. 5. Anemia of CKD. 6. Secondary hyperparathyroidism. 7. Seizure disorder noted 10/04/15.  Plan: The patient is seen and evaluated during his 10th hemodialysis treatment. He has not had any appreciable improvement in his neurologic status. We wanted to make sure that he really wasn't playing a role in his mental status.  His BUN is currently 56 with a creatinine of 1.88. We will hold off on further dialysis over the weekend. Agree with pulmonary critical care but the primary issue appears to be a massive brain injury which he does not appear to be recovering from. Pulmonary critical care as deemed tracheostomy to be futile care. Hopefully discussions between primary team  and the patient's family will continue. Overall dismal prognosis.   LOS:  Robert Patrick 5/12/20179:21 AM

## 2015-10-18 NOTE — Consult Note (Unsigned)
NAME:  Robert FrayHEADEN, Robert                 ACCOUNT NO.:  MEDICAL RECORD NO.:  [id number]  LOCATION:                                 FACILITY:  PHYSICIAN:  Kristine GarbeChristopher E. Ezzard StandingNewman, M.D. DATE OF BIRTH:  DATE OF CONSULTATION:  10/17/2015 DATE OF DISCHARGE:                                CONSULTATION   BRIEF HISTORY:  Robert FrayZachariah Patrick is a 75 year old gentleman with history of ischemic cardiomyopathy with ejection fraction of 25%, as well as history of AFib, congestive heart failure, chronic kidney disease, on dialysis, recent acute respiratory failure secondary to aspiration with anoxic brain injury.  He was subsequently intubated and has remained intubated.  [facility].  He is basically nonresponsive secondary to anoxic brain injury. He has continued to receive dialysis.  There is really no plan for extubation or being off the vent, but family would like consideration of placing a tracheostomy.  PHYSICAL EXAMINATION:  On exam, the patient is intubated and nonresponsive.  He has relatively thin neck with trach midline.  No palpable masses.  IMPRESSION:  Very poor prognosis.  If family wants to proceed with a trach and the patient has continued to receive medical therapy with some hope of recovery, can place the tracheotomy next week in the operating room if needed.  We would re-consult with family and contact my office if they want to proceed with the tracheotomy and can schedule this next week.          ______________________________ Kristine Garbehristopher E. Ezzard StandingNewman, M.D.     CEN/MEDQ  D:  10/17/2015  T:  10/18/2015  Job:  [id number]

## 2015-10-19 LAB — URINALYSIS, ROUTINE W REFLEX MICROSCOPIC
GLUCOSE, UA: NEGATIVE mg/dL
KETONES UR: 15 mg/dL — AB
Nitrite: POSITIVE — AB
PROTEIN: 100 mg/dL — AB
Specific Gravity, Urine: 1.025 (ref 1.005–1.030)
pH: 5 (ref 5.0–8.0)

## 2015-10-19 LAB — URINE MICROSCOPIC-ADD ON

## 2015-10-20 LAB — URINE CULTURE

## 2015-10-21 LAB — COMPREHENSIVE METABOLIC PANEL
ALK PHOS: 214 U/L — AB (ref 38–126)
ALT: 64 U/L — AB (ref 17–63)
AST: 50 U/L — AB (ref 15–41)
Albumin: 1.8 g/dL — ABNORMAL LOW (ref 3.5–5.0)
Anion gap: 11 (ref 5–15)
BILIRUBIN TOTAL: 0.6 mg/dL (ref 0.3–1.2)
BUN: 100 mg/dL — AB (ref 6–20)
CALCIUM: 8.1 mg/dL — AB (ref 8.9–10.3)
CO2: 27 mmol/L (ref 22–32)
CREATININE: 2.28 mg/dL — AB (ref 0.61–1.24)
Chloride: 94 mmol/L — ABNORMAL LOW (ref 101–111)
GFR, EST AFRICAN AMERICAN: 31 mL/min — AB (ref >60)
GFR, EST NON AFRICAN AMERICAN: 27 mL/min — AB (ref >60)
Glucose, Bld: 224 mg/dL — ABNORMAL HIGH (ref 65–99)
Potassium: 4.4 mmol/L (ref 3.5–5.1)
Sodium: 132 mmol/L — ABNORMAL LOW (ref 135–145)
TOTAL PROTEIN: 6.2 g/dL — AB (ref 6.5–8.1)

## 2015-10-21 LAB — CBC
HCT: 28.3 % — ABNORMAL LOW (ref 39.0–52.0)
Hemoglobin: 8.8 g/dL — ABNORMAL LOW (ref 13.0–17.0)
MCH: 25.5 pg — ABNORMAL LOW (ref 26.0–34.0)
MCHC: 31.1 g/dL (ref 30.0–36.0)
MCV: 82 fL (ref 78.0–100.0)
PLATELETS: 139 10*3/uL — AB (ref 150–400)
RBC: 3.45 MIL/uL — AB (ref 4.22–5.81)
RDW: 17.5 % — AB (ref 11.5–15.5)
WBC: 9.5 10*3/uL (ref 4.0–10.5)

## 2015-10-21 LAB — MAGNESIUM: MAGNESIUM: 2.3 mg/dL (ref 1.7–2.4)

## 2015-10-21 LAB — PHOSPHORUS: PHOSPHORUS: 4.2 mg/dL (ref 2.5–4.6)

## 2015-10-21 NOTE — Progress Notes (Signed)
Subjective:  Patient had HD today. He has completed 11 dialysis treatments without significant change in his mental status. He remains on the ventilator at the moment.  Had a long discussion with the pt's daugther. Previously we agreed to a time limited trial of dialysis of 10 treatments. Objective:  Vital signs in last 24 hours:  Temp 97.9 pulse 80 respiratory rate 28 blood pressure 147/86   Physical Exam: General: Critically ill-appearing  HEENT ET tube, OGT, eyes open periodically  Neck supple  Pulm/lungs Ventilator dependent, bilateral rhonchi  CVS/Heart Irregular, A Fib  Abdomen:  Soft, nondistended, BS present  Extremities: + peripheral edema, RLE amputation noted  Neurologic: Not following commands, no response to sternal rub, eyes open  Skin: No rashes  Access: Right arm AV graft       Basic Metabolic Panel:   Recent Labs Lab 10/15/15 0751 10/16/15 0620 10/17/15 0724 10/18/15 0633 10/21/15 0529  NA 140 138 136 132* 132*  K 4.1 3.8 4.1 3.9 4.4  CL 101 98* 97* 95* 94*  CO2 29 28 26 26 27   GLUCOSE 111* 104* 265* 221* 224*  BUN 70* 64* 56* 56* 100*  CREATININE 2.02* 1.90* 1.93* 1.88* 2.28*  CALCIUM 8.2* 8.2* 8.0* 8.0* 8.1*  MG  --   --   --   --  2.3  PHOS 3.9 3.6 3.8 3.0 4.2     CBC:  Recent Labs Lab 10/15/15 0751 10/16/15 0620 10/17/15 0724 10/18/15 0633 10/21/15 0529  WBC 12.7* 11.7* 10.7* 11.1* 9.5  HGB 10.3* 9.2* 9.0* 8.7* 8.8*  HCT 33.4* 29.5* 28.6* 27.2* 28.3*  MCV 85.4 83.8 83.6 83.4 82.0  PLT 174 160 153 142* 139*      Microbiology:  Recent Results (from the past 720 hour(s))  Urine culture     Status: Abnormal   Collection Time: 10/19/15 10:00 AM  Result Value Ref Range Status   Specimen Description URINE, RANDOM  Final   Special Requests NONE  Final   Culture >=100,000 COLONIES/mL YEAST (A)  Final   Report Status 10/20/2015 FINAL  Final    Coagulation Studies: No results for input(s): LABPROT, INR in the last 72  hours.  Urinalysis:  Recent Labs  10/19/15 1058  COLORURINE BROWN*  LABSPEC 1.025  PHURINE 5.0  GLUCOSEU NEGATIVE  HGBUR LARGE*  BILIRUBINUR MODERATE*  KETONESUR 15*  PROTEINUR 100*  NITRITE POSITIVE*  LEUKOCYTESUR LARGE*      Imaging: No results found.   Medications:       Assessment/ Plan:  75 y.o. 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   1. Acute renal failure. Hx of RUE AV access, HD 5/2, 5/3, 5/4, 5/5, 5/6, 5/8, 5/9, 5/10, 5/11,  5/12, 5/15 2. CKD stage IV. 3. Recent PEA arrest with anoxic brain injury. 4. Acute Respiratory failure. 5. Anemia of CKD. 6. Secondary hyperparathyroidism. 7. Seizure disorder noted 10/04/15.  Plan: Met with the pt's daughter today.  Pt has had 11 dialysis treatments to date including today without significant improvement in his overall mental status.  We had agreed to a time limited trial of dialysis of 10 treatments.  Without any significant improvement no further dialysis indicated at this time.  Family to meet with Dr. Laren Everts to discuss  further options.  Family would like to consider tracheostomy but we explained this will likely not change the clinical outcome.  No further renal input at this time, therefore we will sign off.  Please call back with any further questions and concerns.    LOS:  Casia Corti 5/15/20173:43 PM

## 2015-10-21 NOTE — Progress Notes (Signed)
   Name: Robert Patrick MRN: [id number] DOB: 1940-06-21    ADMISSION DATE:  09/21/2015 CONSULTATION DATE:  4/27  REFERRING MD :  Sharyon MedicusHIjazi  CHIEF COMPLAINT:  Vent management   BRIEF PATIENT DESCRIPTION: 75yo male with hx CHF, Ischemic cardiomyopathy (EF25%), AFib, CKD IV admitted to outside hospital with acute respiratory failure r/t aspiration PNA.  Course was c/b PEA arrest and subsequent anoxic brain injury.  He was tx 4/27 to [facility] for further vent weaning efforts.  Remains orally intubated, poor neurologic prognosis, deemed poor candidate for HD by nephrology.    SIGNIFICANT EVENTS  4/21  admitted for SOB 4/22  pt arrested. Anoxic injury. Intubated 4/26  [facility] 4/27  PCCM consulted  SUBJECTIVE: No acute events. Not issues over the weekend.   VITAL SIGNS: 99.2, 85, 14, 120/70, 99% on 28% FiO2  PHYSICAL EXAMINATION: General:  Thin, chronically ill appearing male, NAD on vent Neuro:  Does not withdraw to pain, no corneal reflex, will not withdraw to pain, no cough to suctioning; will open eyes spontaneously but not to command Pulmonary: vent supported breaths, normal air entry CV: RRR, no mgr GI: BS+, soft, MSK: diminished bulk and tone   Recent Labs Lab 10/17/15 0724 10/18/15 0633 10/21/15 0529  NA 136 132* 132*  K 4.1 3.9 4.4  CL 97* 95* 94*  CO2 26 26 27   BUN 56* 56* 100*  CREATININE 1.93* 1.88* 2.28*  GLUCOSE 265* 221* 224*    Recent Labs Lab 10/17/15 0724 10/18/15 0633 10/21/15 0529  HGB 9.0* 8.7* 8.8*  HCT 28.6* 27.2* 28.3*  WBC 10.7* 11.1* 9.5  PLT 153 142* 139*   I reviewed CXR myself, ETT in good position  ASSESSMENT / PLAN:  Acute respiratory failure - r/t aspiration PNA initially, now c/b PEA arrest with subsequent anoxic injury.  Remains orally intubated, vent dependent.  Unable to wean/extubate r/t poor mental status.   Bilateral Pleural Effusions  PLAN: -Hold weaning. -Janina Mayorach would be futile  care. -Intermittent f/u CXR -Abx per primary -No extubation given neuro status.  CKD IV  Ischemic cardiomyopathy - EF 25% -- previously deemed not AICD candidate r/t poor compliance  DM  AFib   Anoxic encephalopathy: Given his severe anoxic brain injury and comorbid illnesses, he will not survive this illness.  Patient's neuro exam is bismal at best.  Tracheostomy would be futile care and will not be provided in this case.  Would continue discussion with family as this is not a survivable situation.  Discussed case with Dr. Sharyon MedicusHijazi. He said family wants everything done. He said they will proceed with tracheostomy. Will need PEG as well with trache.   Robert MeyerJ. Angelo A de Dios, MD 10/21/2015, 5:34 PM [facility] Pager [phone] After 3 pm or if no answer, call [phone]

## 2015-10-22 LAB — COMPREHENSIVE METABOLIC PANEL
ALBUMIN: 1.9 g/dL — AB (ref 3.5–5.0)
ALK PHOS: 245 U/L — AB (ref 38–126)
ALT: 59 U/L (ref 17–63)
AST: 47 U/L — ABNORMAL HIGH (ref 15–41)
Anion gap: 9 (ref 5–15)
BUN: 68 mg/dL — ABNORMAL HIGH (ref 6–20)
CALCIUM: 8.2 mg/dL — AB (ref 8.9–10.3)
CO2: 30 mmol/L (ref 22–32)
CREATININE: 1.87 mg/dL — AB (ref 0.61–1.24)
Chloride: 95 mmol/L — ABNORMAL LOW (ref 101–111)
GFR calc Af Amer: 39 mL/min — ABNORMAL LOW (ref >60)
GFR calc non Af Amer: 34 mL/min — ABNORMAL LOW (ref >60)
GLUCOSE: 172 mg/dL — AB (ref 65–99)
Potassium: 3.7 mmol/L (ref 3.5–5.1)
SODIUM: 134 mmol/L — AB (ref 135–145)
Total Bilirubin: 0.9 mg/dL (ref 0.3–1.2)
Total Protein: 6.7 g/dL (ref 6.5–8.1)

## 2015-10-22 LAB — CBC
HCT: 26.4 % — ABNORMAL LOW (ref 39.0–52.0)
Hemoglobin: 8.4 g/dL — ABNORMAL LOW (ref 13.0–17.0)
MCH: 26.9 pg (ref 26.0–34.0)
MCHC: 31.8 g/dL (ref 30.0–36.0)
MCV: 84.6 fL (ref 78.0–100.0)
PLATELETS: 123 10*3/uL — AB (ref 150–400)
RBC: 3.12 MIL/uL — ABNORMAL LOW (ref 4.22–5.81)
RDW: 17.8 % — ABNORMAL HIGH (ref 11.5–15.5)
WBC: 8.1 10*3/uL (ref 4.0–10.5)

## 2015-10-23 LAB — COMPREHENSIVE METABOLIC PANEL
ALBUMIN: 1.7 g/dL — AB (ref 3.5–5.0)
ALK PHOS: 221 U/L — AB (ref 38–126)
ALT: 50 U/L (ref 17–63)
ANION GAP: 12 (ref 5–15)
AST: 40 U/L (ref 15–41)
BUN: 84 mg/dL — ABNORMAL HIGH (ref 6–20)
CHLORIDE: 95 mmol/L — AB (ref 101–111)
CO2: 27 mmol/L (ref 22–32)
Calcium: 8.3 mg/dL — ABNORMAL LOW (ref 8.9–10.3)
Creatinine, Ser: 1.96 mg/dL — ABNORMAL HIGH (ref 0.61–1.24)
GFR calc Af Amer: 37 mL/min — ABNORMAL LOW (ref >60)
GFR calc non Af Amer: 32 mL/min — ABNORMAL LOW (ref >60)
Glucose, Bld: 178 mg/dL — ABNORMAL HIGH (ref 65–99)
POTASSIUM: 4 mmol/L (ref 3.5–5.1)
SODIUM: 134 mmol/L — AB (ref 135–145)
Total Bilirubin: 0.8 mg/dL (ref 0.3–1.2)
Total Protein: 5.9 g/dL — ABNORMAL LOW (ref 6.5–8.1)

## 2015-10-23 LAB — CBC
HCT: 27.5 % — ABNORMAL LOW (ref 39.0–52.0)
HEMOGLOBIN: 8.4 g/dL — AB (ref 13.0–17.0)
MCH: 25.3 pg — AB (ref 26.0–34.0)
MCHC: 30.5 g/dL (ref 30.0–36.0)
MCV: 82.8 fL (ref 78.0–100.0)
Platelets: 144 10*3/uL — ABNORMAL LOW (ref 150–400)
RBC: 3.32 MIL/uL — AB (ref 4.22–5.81)
RDW: 17.3 % — ABNORMAL HIGH (ref 11.5–15.5)
WBC: 8.9 10*3/uL (ref 4.0–10.5)

## 2015-10-23 NOTE — Progress Notes (Signed)
Subjective:  We are asked to see the patient back in followup. The family has decided upon pursuing tracheostomy and PEG tube placement. This is to be performed tomorrow. We have been asked to see him to reinitiate dialysis per family request. Objective:  Vital signs in last 24 hours:  Temperature 98.9 pulse 85 respirations 33 blood pressure 144/67   Physical Exam: General: Critically ill-appearing  HEENT ET tube, OGT, eyes open periodically  Neck supple  Pulm/lungs Ventilator dependent, bilateral rhonchi  CVS/Heart Irregular, A Fib  Abdomen:  Soft, nondistended, BS present  Extremities: + peripheral edema, RLE amputation noted  Neurologic: Not following commands, no response to sternal rub, eyes open occasionally  Skin: No rashes  Access: Right arm AV graft       Basic Metabolic Panel:   Recent Labs Lab 10/17/15 0724 10/18/15 0633 10/21/15 0529 10/22/15 1234 10/23/15 0712  NA 136 132* 132* 134* 134*  K 4.1 3.9 4.4 3.7 4.0  CL 97* 95* 94* 95* 95*  CO2 _0 GLUCOSE 265* 221* 224* 172* 178*  BUN 56* 56* 100* 68* 84*  CREATININE 1.93* 1.88* 2.28* 1.87* 1.96*  CALCIUM 8.0* 8.0* 8.1* 8.2* 8.3*  MG  --   --  2.3  --   --   PHOS 3.8 3.0 4.2  --   --      CBC:  Recent Labs Lab 10/17/15 0724 10/18/15 0633 10/21/15 0529 10/22/15 1234 10/23/15 0712  WBC 10.7* 11.1* 9.5 8.1 8.9  HGB 9.0* 8.7* 8.8* 8.4* 8.4*  HCT 28.6* 27.2* 28.3* 26.4* 27.5*  MCV 83.6 83.4 82.0 84.6 82.8  PLT 153 142* 139* 123* 144*      Microbiology:  Recent Results (from the past 720 hour(s))  Urine culture     Status: Abnormal   Collection Time: 10/19/15 10:00 AM  Result Value Ref Range Status   Specimen Description URINE, RANDOM  Final   Special Requests NONE  Final   Culture >=100,000 COLONIES/mL YEAST (A)  Final   Report Status 10/20/2015 FINAL  Final    Coagulation Studies: No results for input(s): LABPROT, INR in the last 72 hours.  Urinalysis: No results for  input(s): COLORURINE, LABSPEC, PHURINE, GLUCOSEU, HGBUR, BILIRUBINUR, KETONESUR, PROTEINUR, UROBILINOGEN, NITRITE, LEUKOCYTESUR in the last 72 hours.  Invalid input(s): APPERANCEUR    Imaging: No results found.   Medications:       Assessment/ Plan:  75 y.o. male with a PMHx of severe ischemic cardiopathy with ejection fraction of 25%, atrial fibrillation, congestive heart failure, chronic kidney disease stage IV, recent acute respiratory failure secondary to aspiration pneumonia, recent PEA arrest with anoxic brain injury, who was admitted to [facility] on 10/05/2015 for ongoing weaning efforts.   1. Acute renal failure. Hx of RUE AV access, HD 5/2, 5/3, 5/4, 5/5, 5/6, 5/8, 5/9, 5/10, 5/11,  5/12, 5/15 2. CKD stage IV. 3. Recent PEA arrest with anoxic brain injury. 4. Acute Respiratory failure. 5. Anemia of CKD. 6. Secondary hyperparathyroidism. 7. Seizure disorder noted 10/04/15.  Plan: We met with the patient's family during the last visit. Since that point in time the patient's family has decided to pursue ongoing aggressive care including placement of tracheostomy and PEG tube. In addition they have requested that dialysis be reinitiated as well. As before the patient's prognosis appears to be quite grim with no significant neurologic recovery thus far. More than likely if he were to survive this current illness he would have to go to  a facility that could accommodate a tracheostomy, PEG tube, and dialysis support. Unfortunately the only a handful of facilities like this in the [place]. This was explained to the family previously. Patient's family wishes to proceed. We will restart the patient on dialysis tomorrow. Overall extremely guarded prognosis.   LOS:  LATEEF, MUNSOOR 5/17/20174:15 PM    

## 2015-10-24 ENCOUNTER — Encounter (HOSPITAL_COMMUNITY): Payer: Self-pay | Admitting: Anesthesiology

## 2015-10-24 ENCOUNTER — Encounter: Admission: AD | Disposition: E | Payer: Self-pay | Source: Ambulatory Visit | Attending: Internal Medicine

## 2015-10-24 ENCOUNTER — Encounter: Payer: Self-pay | Admitting: Certified Registered Nurse Anesthetist

## 2015-10-24 HISTORY — PX: TRACHEOSTOMY TUBE PLACEMENT: SHX814

## 2015-10-24 LAB — RENAL FUNCTION PANEL
ALBUMIN: 1.7 g/dL — AB (ref 3.5–5.0)
ANION GAP: 14 (ref 5–15)
BUN: 94 mg/dL — AB (ref 6–20)
CALCIUM: 8.4 mg/dL — AB (ref 8.9–10.3)
CO2: 28 mmol/L (ref 22–32)
CREATININE: 1.96 mg/dL — AB (ref 0.61–1.24)
Chloride: 95 mmol/L — ABNORMAL LOW (ref 101–111)
GFR calc Af Amer: 37 mL/min — ABNORMAL LOW (ref >60)
GFR calc non Af Amer: 32 mL/min — ABNORMAL LOW (ref >60)
GLUCOSE: 55 mg/dL — AB (ref 65–99)
PHOSPHORUS: 3.4 mg/dL (ref 2.5–4.6)
Potassium: 4 mmol/L (ref 3.5–5.1)
SODIUM: 137 mmol/L (ref 135–145)

## 2015-10-24 LAB — COMPREHENSIVE METABOLIC PANEL
ALBUMIN: 1.7 g/dL — AB (ref 3.5–5.0)
ALT: 52 U/L (ref 17–63)
ANION GAP: 12 (ref 5–15)
AST: 47 U/L — ABNORMAL HIGH (ref 15–41)
Alkaline Phosphatase: 218 U/L — ABNORMAL HIGH (ref 38–126)
BUN: 95 mg/dL — ABNORMAL HIGH (ref 6–20)
CO2: 30 mmol/L (ref 22–32)
Calcium: 8.4 mg/dL — ABNORMAL LOW (ref 8.9–10.3)
Chloride: 95 mmol/L — ABNORMAL LOW (ref 101–111)
Creatinine, Ser: 1.97 mg/dL — ABNORMAL HIGH (ref 0.61–1.24)
GFR calc Af Amer: 37 mL/min — ABNORMAL LOW (ref >60)
GFR calc non Af Amer: 32 mL/min — ABNORMAL LOW (ref >60)
GLUCOSE: 57 mg/dL — AB (ref 65–99)
POTASSIUM: 4 mmol/L (ref 3.5–5.1)
SODIUM: 137 mmol/L (ref 135–145)
TOTAL PROTEIN: 6.5 g/dL (ref 6.5–8.1)
Total Bilirubin: 0.7 mg/dL (ref 0.3–1.2)

## 2015-10-24 LAB — CBC
HEMATOCRIT: 28.3 % — AB (ref 39.0–52.0)
HEMOGLOBIN: 9 g/dL — AB (ref 13.0–17.0)
MCH: 26.5 pg (ref 26.0–34.0)
MCHC: 31.8 g/dL (ref 30.0–36.0)
MCV: 83.5 fL (ref 78.0–100.0)
PLATELETS: 147 10*3/uL — AB (ref 150–400)
RBC: 3.39 MIL/uL — AB (ref 4.22–5.81)
RDW: 17.4 % — ABNORMAL HIGH (ref 11.5–15.5)
WBC: 8.7 10*3/uL (ref 4.0–10.5)

## 2015-10-24 SURGERY — CREATION, TRACHEOSTOMY
Anesthesia: General | Site: Neck

## 2015-10-24 MED ORDER — FENTANYL CITRATE (PF) 250 MCG/5ML IJ SOLN
INTRAMUSCULAR | Status: AC
  Filled 2015-10-24: qty 5

## 2015-10-24 MED ORDER — ONDANSETRON HCL 4 MG/2ML IJ SOLN
INTRAMUSCULAR | Status: DC | PRN
  Administered 2015-10-24: 4 mg via INTRAVENOUS

## 2015-10-24 MED ORDER — 0.9 % SODIUM CHLORIDE (POUR BTL) OPTIME
TOPICAL | Status: DC | PRN
  Administered 2015-10-24: 1000 mL

## 2015-10-24 MED ORDER — ONDANSETRON HCL 4 MG/2ML IJ SOLN
INTRAMUSCULAR | Status: AC
  Filled 2015-10-24: qty 4

## 2015-10-24 MED ORDER — SODIUM CHLORIDE 0.9 % IV SOLN
INTRAVENOUS | Status: DC | PRN
  Administered 2015-10-24: 10:00:00 via INTRAVENOUS

## 2015-10-24 MED ORDER — PHENYLEPHRINE HCL 10 MG/ML IJ SOLN
INTRAMUSCULAR | Status: DC | PRN
  Administered 2015-10-24: 40 ug via INTRAVENOUS

## 2015-10-24 MED ORDER — FENTANYL CITRATE (PF) 250 MCG/5ML IJ SOLN
INTRAMUSCULAR | Status: DC | PRN
  Administered 2015-10-24: 50 ug via INTRAVENOUS

## 2015-10-24 MED ORDER — LIDOCAINE-EPINEPHRINE 1 %-1:100000 IJ SOLN
INTRAMUSCULAR | Status: DC | PRN
  Administered 2015-10-24: 20 mL

## 2015-10-24 MED ORDER — MIDAZOLAM HCL 5 MG/5ML IJ SOLN
INTRAMUSCULAR | Status: DC | PRN
  Administered 2015-10-24: 1 mg via INTRAVENOUS

## 2015-10-24 MED ORDER — DEXAMETHASONE SODIUM PHOSPHATE 4 MG/ML IJ SOLN
INTRAMUSCULAR | Status: DC | PRN
  Administered 2015-10-24: 10 mg via INTRAVENOUS

## 2015-10-24 MED ORDER — MIDAZOLAM HCL 2 MG/2ML IJ SOLN
INTRAMUSCULAR | Status: AC
  Filled 2015-10-24: qty 2

## 2015-10-24 MED ORDER — PROPOFOL 10 MG/ML IV BOLUS
INTRAVENOUS | Status: DC | PRN
  Administered 2015-10-24: 50 mg via INTRAVENOUS

## 2015-10-24 SURGICAL SUPPLY — 45 items
ATTRACTOMAT 16X20 MAGNETIC DRP (DRAPES) IMPLANT
BLADE SURG 15 STRL LF DISP TIS (BLADE) ×1 IMPLANT
BLADE SURG 15 STRL SS (BLADE) ×2
CANISTER SUCTION 2500CC (MISCELLANEOUS) ×3 IMPLANT
CLEANER TIP ELECTROSURG 2X2 (MISCELLANEOUS) ×3 IMPLANT
COVER SURGICAL LIGHT HANDLE (MISCELLANEOUS) ×3 IMPLANT
DRAPE PROXIMA HALF (DRAPES) IMPLANT
ELECT COATED BLADE 2.86 ST (ELECTRODE) ×3 IMPLANT
ELECT REM PT RETURN 9FT ADLT (ELECTROSURGICAL) ×3
ELECTRODE REM PT RTRN 9FT ADLT (ELECTROSURGICAL) ×1 IMPLANT
GAUZE SPONGE 4X4 16PLY XRAY LF (GAUZE/BANDAGES/DRESSINGS) ×3 IMPLANT
GEL ULTRASOUND 20GR AQUASONIC (MISCELLANEOUS) ×3 IMPLANT
GLOVE BIOGEL PI IND STRL 6.5 (GLOVE) ×1 IMPLANT
GLOVE BIOGEL PI INDICATOR 6.5 (GLOVE) ×2
GLOVE SS BIOGEL STRL SZ 7.5 (GLOVE) ×1 IMPLANT
GLOVE SUPERSENSE BIOGEL SZ 7.5 (GLOVE) ×2
GLOVE SURG SS PI 6.5 STRL IVOR (GLOVE) ×3 IMPLANT
GLOVE SURG SS PI 8.0 STRL IVOR (GLOVE) ×3 IMPLANT
GOWN STRL REUS W/ TWL LRG LVL3 (GOWN DISPOSABLE) ×2 IMPLANT
GOWN STRL REUS W/ TWL XL LVL3 (GOWN DISPOSABLE) ×1 IMPLANT
GOWN STRL REUS W/TWL LRG LVL3 (GOWN DISPOSABLE) ×4
GOWN STRL REUS W/TWL XL LVL3 (GOWN DISPOSABLE) ×2
HOLDER TRACH TUBE VELCRO 19.5 (MISCELLANEOUS) ×3 IMPLANT
KIT BASIN OR (CUSTOM PROCEDURE TRAY) ×3 IMPLANT
KIT ROOM TURNOVER OR (KITS) ×3 IMPLANT
KIT SUCTION CATH 14FR (SUCTIONS) ×3 IMPLANT
NEEDLE HYPO 25GX1X1/2 BEV (NEEDLE) ×3 IMPLANT
NS IRRIG 1000ML POUR BTL (IV SOLUTION) ×3 IMPLANT
PACK EENT II TURBAN DRAPE (CUSTOM PROCEDURE TRAY) ×3 IMPLANT
PAD ARMBOARD 7.5X6 YLW CONV (MISCELLANEOUS) ×3 IMPLANT
PENCIL BUTTON HOLSTER BLD 10FT (ELECTRODE) ×3 IMPLANT
SOLUTION BETADINE 4OZ (MISCELLANEOUS) ×3 IMPLANT
SPONGE DRAIN TRACH 4X4 STRL 2S (GAUZE/BANDAGES/DRESSINGS) ×3 IMPLANT
SPONGE INTESTINAL PEANUT (DISPOSABLE) ×3 IMPLANT
SUT SILK 2 0 SH CR/8 (SUTURE) ×3 IMPLANT
SUT SILK 3 0 TIES 10X30 (SUTURE) IMPLANT
SYR 20CC LL (SYRINGE) ×3 IMPLANT
SYR CONTROL 10ML LL (SYRINGE) ×3 IMPLANT
TOWEL OR 17X24 6PK STRL BLUE (TOWEL DISPOSABLE) ×3 IMPLANT
TOWEL OR 17X26 10 PK STRL BLUE (TOWEL DISPOSABLE) ×3 IMPLANT
TUBE CONNECTING 12'X1/4 (SUCTIONS) ×1
TUBE CONNECTING 12X1/4 (SUCTIONS) ×2 IMPLANT
TUBE TRACH SHILEY  6 DIST  CUF (TUBING) ×3 IMPLANT
TUBE TRACH SHILEY 10 DIST CUFF (TUBING) IMPLANT
TUBE TRACH SHILEY 8 DIST CUF (TUBING) IMPLANT

## 2015-10-24 NOTE — OR Nursing (Signed)
OR note: Obturator taped to head of bed.

## 2015-10-24 NOTE — Anesthesia Preprocedure Evaluation (Addendum)
Anesthesia Evaluation  Patient identified by MRN, date of birth, ID band Patient awake    Reviewed: Allergy & Precautions, NPO status , Patient's Chart, lab work & pertinent test results  Airway Mallampati: Intubated  TM Distance: >3 FB Neck ROM: Full    Dental no notable dental hx.    Pulmonary  resp failure   Pulmonary exam normal breath sounds clear to auscultation + decreased breath sounds      Cardiovascular + Peripheral Vascular Disease and +CHF  Normal cardiovascular exam Rhythm:Regular Rate:Normal  EF 25%   Neuro/Psych Anoxic brain injury negative psych ROS   GI/Hepatic negative GI ROS, Neg liver ROS,   Endo/Other  diabetes  Renal/GU Renal InsufficiencyRenal disease  negative genitourinary   Musculoskeletal negative musculoskeletal ROS (+)   Abdominal   Peds negative pediatric ROS (+)  Hematology  (+) anemia ,   Anesthesia Other Findings   Reproductive/Obstetrics negative OB ROS                            Anesthesia Physical Anesthesia Plan  ASA: IV  Anesthesia Plan: General   Post-op Pain Management:    Induction: Intravenous  Airway Management Planned: Oral ETT  Additional Equipment:   Intra-op Plan:   Post-operative Plan: Post-operative intubation/ventilation  Informed Consent: I have reviewed the patients History and Physical, chart, labs and discussed the procedure including the risks, benefits and alternatives for the proposed anesthesia with the patient or authorized representative who has indicated his/her understanding and acceptance.   Dental advisory given  Plan Discussed with: CRNA and Surgeon  Anesthesia Plan Comments:         Anesthesia Quick Evaluation

## 2015-10-24 NOTE — Transfer of Care (Signed)
Immediate Anesthesia Transfer of Care Note  Patient: Robert Patrick  Procedure(s) Performed: Procedure(s): TRACHEOSTOMY (N/A)  [facility]  Anesthesia Type:General  Level of Consciousness: awake  Airway & Oxygen Therapy: Patient remains intubated per anesthesia plan and Patient placed on Ventilator (see vital sign flow sheet for setting)  Post-op Assessment: Report given to RN and Post -op Vital signs reviewed and stable  Post vital signs: Reviewed and stable  Last Vitals: There were no vitals filed for this visit.  Last Pain: There were no vitals filed for this visit.       Complications: No apparent anesthesia complications

## 2015-10-24 NOTE — Brief Op Note (Signed)
09/07/2015 - 10/16/15  10:57 AM  PATIENT:  Robert Patrick  75 y.o. male  PRE-OPERATIVE DIAGNOSIS:  respiratory failure  POST-OPERATIVE DIAGNOSIS:  respiratory failure  PROCEDURE:  Procedure(s): TRACHEOSTOMY (N/A)  SURGEON:  Surgeon(s) and Role:    * Drema Halonhristopher E Newman, MD - Primary  PHYSICIAN ASSISTANT:   ASSISTANTS: none   ANESTHESIA:   general  EBL:     BLOOD ADMINISTERED:none  DRAINS: none   LOCAL MEDICATIONS USED:  XYLOCAINE with EPI 3 cc  SPECIMEN:  No Specimen  DISPOSITION OF SPECIMEN:  N/A  COUNTS:  YES  TOURNIQUET:  * No tourniquets in log *  DICTATION: .Other Dictation: Dictation Number [id number]  PLAN OF CARE: Discharge to home after PACU  PATIENT DISPOSITION:  PACU - hemodynamically stable.   Delay start of Pharmacological VTE agent (>24hrs) due to surgical blood loss or risk of bleeding: not applicable

## 2015-10-25 ENCOUNTER — Encounter (HOSPITAL_COMMUNITY): Payer: Self-pay | Admitting: Otolaryngology

## 2015-10-25 LAB — CBC
HCT: 30.2 % — ABNORMAL LOW (ref 39.0–52.0)
Hemoglobin: 9.1 g/dL — ABNORMAL LOW (ref 13.0–17.0)
MCH: 25.5 pg — AB (ref 26.0–34.0)
MCHC: 30.1 g/dL (ref 30.0–36.0)
MCV: 84.6 fL (ref 78.0–100.0)
PLATELETS: 155 10*3/uL (ref 150–400)
RBC: 3.57 MIL/uL — ABNORMAL LOW (ref 4.22–5.81)
RDW: 17.4 % — AB (ref 11.5–15.5)
WBC: 10.4 10*3/uL (ref 4.0–10.5)

## 2015-10-25 LAB — COMPREHENSIVE METABOLIC PANEL
ALT: 59 U/L (ref 17–63)
AST: 59 U/L — AB (ref 15–41)
Albumin: 1.8 g/dL — ABNORMAL LOW (ref 3.5–5.0)
Alkaline Phosphatase: 242 U/L — ABNORMAL HIGH (ref 38–126)
Anion gap: 11 (ref 5–15)
BUN: 65 mg/dL — AB (ref 6–20)
CHLORIDE: 96 mmol/L — AB (ref 101–111)
CO2: 30 mmol/L (ref 22–32)
CREATININE: 1.66 mg/dL — AB (ref 0.61–1.24)
Calcium: 8.3 mg/dL — ABNORMAL LOW (ref 8.9–10.3)
GFR calc Af Amer: 45 mL/min — ABNORMAL LOW (ref >60)
GFR calc non Af Amer: 39 mL/min — ABNORMAL LOW (ref >60)
Glucose, Bld: 186 mg/dL — ABNORMAL HIGH (ref 65–99)
Potassium: 4.3 mmol/L (ref 3.5–5.1)
SODIUM: 137 mmol/L (ref 135–145)
Total Bilirubin: 0.7 mg/dL (ref 0.3–1.2)
Total Protein: 6.8 g/dL (ref 6.5–8.1)

## 2015-10-25 NOTE — Op Note (Signed)
NAMHulan Patrick:  Paparella, [name]            ACCOUNT NO.:  [id number]  MEDICAL RECORD NO.:  [id number]  LOCATION:                                 FACILITY:  PHYSICIAN:  Kristine GarbeChristopher E. Ezzard StandingNewman, M.D.DATE OF BIRTH:  May 28, 1941  DATE OF PROCEDURE:  10/21/2015 DATE OF DISCHARGE:                              OPERATIVE REPORT   PREOPERATIVE DIAGNOSIS:  Acute-on-chronic respiratory failure.  POSTOPERATIVE DIAGNOSIS:  Acute-on-chronic respiratory failure.  OPERATION PERFORMED:  Tracheostomy with a #6 Shiley cuffed trach.  SURGEON:  Kristine GarbeChristopher E. Ezzard StandingNewman, M.D.  ANESTHESIA:  General endotracheal.  COMPLICATIONS:  None.  ESTIMATED BLOOD LOSS:  Minimal.  BRIEF CLINICAL NOTE:  Robert FrayZachariah Patrick is a 75 year old gentleman, [facility], had been on ventilator for several weeks.  He was taken to the operating room at this time for tracheostomy.  DESCRIPTION OF PROCEDURE:  The patient was brought down to the operating room [facility].  The patient remained in his bed.  He had a roll placed beneath his shoulders to extend his neck. The proposed incision site was marked out and injected with 3 mL of Xylocaine with epinephrine.  A vertical incision was made just below the cricoid cartilage.  Subcutaneous tissue was divided with cautery.  Strap muscles were divided in midline and retracted laterally.  Cricoid cartilage, upper tracheal rings were identified.  The thyroid isthmus was divided with the cautery and a horizontal tracheotomy was performed between the first and second tracheal rings.  Endotracheal tube and orogastric tube were removed and #6 Shiley cuffed tracheostomy tube was inserted without difficulty.  The patient was ventilated well. Tracheostomy tube was secured with four 2-0 silk sutures and Velcro trach tape around the neck.  A new nasogastric tube was passed through the left nostril and passed into the stomach and confirmed with  auscultation.  The [facility].    ______________________________ Kristine Garbehristopher E. Ezzard StandingNewman, M.D.   ______________________________ Kristine Garbehristopher E. Ezzard StandingNewman, M.D.    CEN/MEDQ  D:  10/12/2015  T:  10/25/2015  Job:  [id number]

## 2015-10-25 NOTE — Progress Notes (Signed)
  Subjective:  Patient now has tracheostomy in place. He had hemodialysis yesterday. He is due for dialysis again tomorrow.  Objective:  Vital signs in last 24 hours:  Temperature 98.2 pulse 109 respirations 16 blood pressure 117/85   Physical Exam: General: Critically ill-appearing  HEENT [place]/AT eyes open NG in place  Neck Trach in place  Pulm/lungs Ventilator dependent, bilateral rhonchi  CVS/Heart Irregular, A Fib  Abdomen:  Soft, nondistended, BS present  Extremities: + peripheral edema, RLE amputation noted  Neurologic: Not following commands, eyes open, doesn't track  Skin: No rashes  Access: Right arm AV graft       Basic Metabolic Panel:   Recent Labs Lab 10/21/15 0529 10/22/15 1234 10/23/15 0712 11/03/2015 0625 10/25/15 0559  NA 132* 134* 134* 137  137 137  K 4.4 3.7 4.0 4.0  4.0 4.3  CL 94* 95* 95* 95*  95* 96*  CO2 27 30 27 30  28 30   GLUCOSE 224* 172* 178* 57*  55* 186*  BUN 100* 68* 84* 95*  94* 65*  CREATININE 2.28* 1.87* 1.96* 1.97*  1.96* 1.66*  CALCIUM 8.1* 8.2* 8.3* 8.4*  8.4* 8.3*  MG 2.3  --   --   --   --   PHOS 4.2  --   --  3.4  --      CBC:  Recent Labs Lab 10/21/15 0529 10/22/15 1234 10/23/15 0712 10/21/2015 0625 10/25/15 0559  WBC 9.5 8.1 8.9 8.7 10.4  HGB 8.8* 8.4* 8.4* 9.0* 9.1*  HCT 28.3* 26.4* 27.5* 28.3* 30.2*  MCV 82.0 84.6 82.8 83.5 84.6  PLT 139* 123* 144* 147* 155      Microbiology:  Recent Results (from the past 720 hour(s))  Urine culture     Status: Abnormal   Collection Time: 10/19/15 10:00 AM  Result Value Ref Range Status   Specimen Description URINE, RANDOM  Final   Special Requests NONE  Final   Culture >=100,000 COLONIES/mL YEAST (A)  Final   Report Status 10/20/2015 FINAL  Final    Coagulation Studies: No results for input(s): LABPROT, INR in the last 72 hours.  Urinalysis: No results for input(s): COLORURINE, LABSPEC, PHURINE, GLUCOSEU, HGBUR, BILIRUBINUR, KETONESUR, PROTEINUR,  UROBILINOGEN, NITRITE, LEUKOCYTESUR in the last 72 hours.  Invalid input(s): APPERANCEUR    Imaging: No results found.   Medications:       Assessment/ Plan:  75 y.o. male with a PMHx of severe ischemic cardiopathy with ejection fraction of 25%, atrial fibrillation, congestive heart failure, chronic kidney disease stage IV, recent acute respiratory failure secondary to aspiration pneumonia, recent PEA arrest with anoxic brain injury, who was admitted to [facility] on 09/25/2015 for ongoing weaning efforts.   1. Acute renal failure. Hx of RUE AV access 2. CKD stage IV. 3. Recent PEA arrest with anoxic brain injury. 4. Acute Respiratory failure. 5. Anemia of CKD. 6. Secondary hyperparathyroidism. 7. Seizure disorder noted 10/04/15.  Plan: Patient had tracheostomy placed in the setting of poor overall neurologic function. Family has requested that we continue dialysis at this time. As before he will not be outpatient dialysis candidate. Therefore he will likely need to go to a facility that can accommodate his tracheostomy, potential PEG, and hemodialysis. This was explained in depth to the patient's family. We will plan for hemodialysis again tomorrow. Orders to be prepared.   LOS:  Yanilen Adamik 5/19/20179:10 AM

## 2015-10-26 LAB — COMPREHENSIVE METABOLIC PANEL
ALK PHOS: 224 U/L — AB (ref 38–126)
ALT: 48 U/L (ref 17–63)
ANION GAP: 11 (ref 5–15)
AST: 44 U/L — ABNORMAL HIGH (ref 15–41)
Albumin: 1.7 g/dL — ABNORMAL LOW (ref 3.5–5.0)
BILIRUBIN TOTAL: 0.7 mg/dL (ref 0.3–1.2)
BUN: 81 mg/dL — ABNORMAL HIGH (ref 6–20)
CALCIUM: 8.3 mg/dL — AB (ref 8.9–10.3)
CO2: 29 mmol/L (ref 22–32)
CREATININE: 1.87 mg/dL — AB (ref 0.61–1.24)
Chloride: 96 mmol/L — ABNORMAL LOW (ref 101–111)
GFR, EST AFRICAN AMERICAN: 39 mL/min — AB (ref >60)
GFR, EST NON AFRICAN AMERICAN: 34 mL/min — AB (ref >60)
Glucose, Bld: 103 mg/dL — ABNORMAL HIGH (ref 65–99)
Potassium: 3.9 mmol/L (ref 3.5–5.1)
SODIUM: 136 mmol/L (ref 135–145)
TOTAL PROTEIN: 6.4 g/dL — AB (ref 6.5–8.1)

## 2015-10-26 LAB — CBC
HCT: 28.5 % — ABNORMAL LOW (ref 39.0–52.0)
HEMOGLOBIN: 8.6 g/dL — AB (ref 13.0–17.0)
MCH: 25 pg — ABNORMAL LOW (ref 26.0–34.0)
MCHC: 30.2 g/dL (ref 30.0–36.0)
MCV: 82.8 fL (ref 78.0–100.0)
PLATELETS: 147 10*3/uL — AB (ref 150–400)
RBC: 3.44 MIL/uL — AB (ref 4.22–5.81)
RDW: 17.4 % — ABNORMAL HIGH (ref 11.5–15.5)
WBC: 9.4 10*3/uL (ref 4.0–10.5)

## 2015-10-26 LAB — PHOSPHORUS: PHOSPHORUS: 3.5 mg/dL (ref 2.5–4.6)

## 2015-10-27 ENCOUNTER — Other Ambulatory Visit (HOSPITAL_COMMUNITY): Payer: Self-pay

## 2015-10-27 LAB — COMPREHENSIVE METABOLIC PANEL
ALT: 49 U/L (ref 17–63)
AST: 52 U/L — ABNORMAL HIGH (ref 15–41)
Albumin: 1.5 g/dL — ABNORMAL LOW (ref 3.5–5.0)
Alkaline Phosphatase: 224 U/L — ABNORMAL HIGH (ref 38–126)
Anion gap: 10 (ref 5–15)
BUN: 38 mg/dL — ABNORMAL HIGH (ref 6–20)
CO2: 30 mmol/L (ref 22–32)
Calcium: 8.2 mg/dL — ABNORMAL LOW (ref 8.9–10.3)
Chloride: 94 mmol/L — ABNORMAL LOW (ref 101–111)
Creatinine, Ser: 1.2 mg/dL (ref 0.61–1.24)
GFR calc Af Amer: >60 mL/min (ref >60)
GFR calc non Af Amer: 58 mL/min — ABNORMAL LOW (ref >60)
Glucose, Bld: 65 mg/dL (ref 65–99)
Potassium: 3.7 mmol/L (ref 3.5–5.1)
Sodium: 134 mmol/L — ABNORMAL LOW (ref 135–145)
Total Bilirubin: 0.6 mg/dL (ref 0.3–1.2)
Total Protein: 6.2 g/dL — ABNORMAL LOW (ref 6.5–8.1)

## 2015-10-27 LAB — CBC
HCT: 28.7 % — ABNORMAL LOW (ref 39.0–52.0)
Hemoglobin: 8.4 g/dL — ABNORMAL LOW (ref 13.0–17.0)
MCH: 24.9 pg — ABNORMAL LOW (ref 26.0–34.0)
MCHC: 29.3 g/dL — ABNORMAL LOW (ref 30.0–36.0)
MCV: 85.2 fL (ref 78.0–100.0)
Platelets: 150 10*3/uL (ref 150–400)
RBC: 3.37 MIL/uL — ABNORMAL LOW (ref 4.22–5.81)
RDW: 17.4 % — ABNORMAL HIGH (ref 11.5–15.5)
WBC: 9.5 10*3/uL (ref 4.0–10.5)

## 2015-10-28 LAB — COMPREHENSIVE METABOLIC PANEL
ALBUMIN: 1.6 g/dL — AB (ref 3.5–5.0)
ALK PHOS: 231 U/L — AB (ref 38–126)
ALT: 51 U/L (ref 17–63)
AST: 53 U/L — AB (ref 15–41)
Anion gap: 10 (ref 5–15)
BILIRUBIN TOTAL: 0.7 mg/dL (ref 0.3–1.2)
BUN: 55 mg/dL — AB (ref 6–20)
CALCIUM: 8.3 mg/dL — AB (ref 8.9–10.3)
CO2: 30 mmol/L (ref 22–32)
CREATININE: 1.68 mg/dL — AB (ref 0.61–1.24)
Chloride: 96 mmol/L — ABNORMAL LOW (ref 101–111)
GFR calc Af Amer: 45 mL/min — ABNORMAL LOW (ref >60)
GFR calc non Af Amer: 38 mL/min — ABNORMAL LOW (ref >60)
GLUCOSE: 92 mg/dL (ref 65–99)
Potassium: 3.7 mmol/L (ref 3.5–5.1)
SODIUM: 136 mmol/L (ref 135–145)
TOTAL PROTEIN: 6.3 g/dL — AB (ref 6.5–8.1)

## 2015-10-28 LAB — CBC
HCT: 27.4 % — ABNORMAL LOW (ref 39.0–52.0)
HEMOGLOBIN: 8.4 g/dL — AB (ref 13.0–17.0)
MCH: 25.4 pg — AB (ref 26.0–34.0)
MCHC: 30.7 g/dL (ref 30.0–36.0)
MCV: 82.8 fL (ref 78.0–100.0)
PLATELETS: 166 10*3/uL (ref 150–400)
RBC: 3.31 MIL/uL — ABNORMAL LOW (ref 4.22–5.81)
RDW: 17.3 % — AB (ref 11.5–15.5)
WBC: 11.4 10*3/uL — ABNORMAL HIGH (ref 4.0–10.5)

## 2015-10-28 NOTE — Progress Notes (Signed)
   Name: Robert Patrick MRN: [id number] DOB: [birth year]    ADMISSION DATE:  09/26/2015 CONSULTATION DATE:  4/27  REFERRING MD :  Sharyon MedicusHIjazi  CHIEF COMPLAINT:  Vent management   BRIEF PATIENT DESCRIPTION: 75yo male with hx CHF, Ischemic cardiomyopathy (EF25%), AFib, CKD IV admitted to outside hospital with acute respiratory failure r/t aspiration PNA.  Course was c/b PEA arrest and subsequent anoxic brain injury.  He was tx 4/27 to [facility] for further vent weaning efforts.  Remains orally intubated, poor neurologic prognosis, deemed poor candidate for HD by nephrology.    SIGNIFICANT EVENTS  4/21  admitted for SOB 4/22  pt arrested. Anoxic injury. Intubated 4/26  [facility]  PCCM consulted  SUBJECTIVE: No acute events. Failing PS.   VITAL SIGNS: 98.5, 69, 16, 128/65, 98% on 28% FiO2  PHYSICAL EXAMINATION: General:  Thin, chronically ill appearing male, NAD on vent Neuro:  Does not withdraw to pain, no corneal reflex, will not withdraw to pain, no cough to suctioning; will open eyes spontaneously but not to command. Pulmonary: vent supported breaths, normal air entry. CV: RRR, No M/R/G, Nl S1/S2. GI: BS+, soft, NT, ND. MSK: diminished bulk and tone  Recent Labs Lab 10/26/15 0751 10/27/15 0629 10/28/15 0640  NA 136 134* 136  K 3.9 3.7 3.7  CL 96* 94* 96*  CO2 29 30 30   BUN 81* 38* 55*  CREATININE 1.87* 1.20 1.68*  GLUCOSE 103* 65 92    Recent Labs Lab 10/26/15 0751 10/27/15 0629 10/28/15 0640  HGB 8.6* 8.4* 8.4*  HCT 28.5* 28.7* 27.4*  WBC 9.4 9.5 11.4*  PLT 147* 150 166   I reviewed CXR myself, trach in good place  ASSESSMENT / PLAN:  Acute respiratory failure - r/t aspiration PNA initially, now c/b PEA arrest with subsequent anoxic injury.  Remains orally intubated, vent dependent.  Unable to wean/extubate r/t poor mental status.   Bilateral Pleural Effusions  PLAN: -Hold weaning. -Janina Mayorach would be futile  care. -Intermittent f/u CXR -Abx per primary -No extubation given neuro status.  CKD IV  Ischemic cardiomyopathy - EF 25% -- previously deemed not AICD candidate r/t poor compliance  DM  AFib   Anoxic encephalopathy: Given his severe anoxic brain injury and comorbid illnesses, he will not survive this illness.  Patient's neuro exam is bismal at best.  Tracheostomy would be futile care and will not be provided in this case.  Would continue discussion with family as this is not a survivable situation.  Needs GOC discussion, family continues to wish for full code status.  Alyson ReedyWesam G. Yacoub, M.D. [facility]. Pager: [phone]. After hours pager: [phone].

## 2015-10-28 NOTE — Progress Notes (Signed)
Subjective:  Patient now has tracheostomy in place. Last dialysis 5/20 Net fluid removed was 1500 cc.  Objective:  Vital signs in last 24 hours:  Temperature 96.6, pulse 88, respirations 22, blood pressure 111/61   Physical Exam: General: Critically ill-appearing  HEENT [place]/AT eyes open NG in place  Neck Trach in place  Pulm/lungs Ventilator dependent, bilateral rhonchi  CVS/Heart Irregular, A Fib  Abdomen:  Soft, nondistended, BS present  Extremities: + peripheral edema, RLE amputation noted  Neurologic: Not following commands, eyes open, doesn't track  Skin: No rashes  Access: Right arm AV graft       Basic Metabolic Panel:   Recent Labs Lab 2015/11/23 0625 10/25/15 0559 10/26/15 0751 10/27/15 0629 10/28/15 0640  NA 137  137 137 136 134* 136  K 4.0  4.0 4.3 3.9 3.7 3.7  CL 95*  95* 96* 96* 94* 96*  CO2 30  28 30 29 30 30   GLUCOSE 57*  55* 186* 103* 65 92  BUN 95*  94* 65* 81* 38* 55*  CREATININE 1.97*  1.96* 1.66* 1.87* 1.20 1.68*  CALCIUM 8.4*  8.4* 8.3* 8.3* 8.2* 8.3*  PHOS 3.4  --  3.5  --   --      CBC:  Recent Labs Lab 2015/11/23 0625 10/25/15 0559 10/26/15 0751 10/27/15 0629 10/28/15 0640  WBC 8.7 10.4 9.4 9.5 11.4*  HGB 9.0* 9.1* 8.6* 8.4* 8.4*  HCT 28.3* 30.2* 28.5* 28.7* 27.4*  MCV 83.5 84.6 82.8 85.2 82.8  PLT 147* 155 147* 150 166      Microbiology:  Recent Results (from the past 720 hour(s))  Urine culture     Status: Abnormal   Collection Time: 10/19/15 10:00 AM  Result Value Ref Range Status   Specimen Description URINE, RANDOM  Final   Special Requests NONE  Final   Culture >=100,000 COLONIES/mL YEAST (A)  Final   Report Status 10/20/2015 FINAL  Final    Coagulation Studies: No results for input(s): LABPROT, INR in the last 72 hours.  Urinalysis: No results for input(s): COLORURINE, LABSPEC, PHURINE, GLUCOSEU, HGBUR, BILIRUBINUR, KETONESUR, PROTEINUR, UROBILINOGEN, NITRITE, LEUKOCYTESUR in the last 72  hours.  Invalid input(s): APPERANCEUR    Imaging: Dg Abd Portable 1v  10/27/2015  CLINICAL DATA:  Evaluate for ileus.  Pain. EXAM: PORTABLE ABDOMEN - 1 VIEW COMPARISON:  Oct 16, 2015 FINDINGS: NG tube terminates in the left upper quadrant. There is a paucity of bowel gas but no dilated loops are seen. No other acute abnormalities. IMPRESSION: There is a paucity of bowel gas limiting evaluation but no evidence of obstruction or ileus on this study. Electronically Signed   By: Gerome Samavid  Williams III M.D   On: 10/27/2015 15:08     Medications:       Assessment/ Plan:  75 y.o. male with a PMHx of severe ischemic cardiopathy with ejection fraction of 25%, atrial fibrillation, congestive heart failure, chronic kidney disease stage IV, recent acute respiratory failure secondary to aspiration pneumonia, recent PEA arrest with anoxic brain injury, who was admitted to [facility] on 09/22/2015 for ongoing weaning efforts.   1. Acute renal failure. with RUE AVG. Dialysis T-T-S schedule 2. CKD stage IV. 3. Recent PEA arrest with anoxic brain injury. 4. Acute Respiratory failure. 5. Anemia of CKD. 6. Secondary hyperparathyroidism. 7. Seizure disorder noted 10/04/15.  Plan: Patient had tracheostomy placed in the setting of poor overall neurologic function. Family has requested that we continue dialysis at this time. As before he will  not be outpatient dialysis candidate. Therefore he will likely need to go to a facility that can accommodate his tracheostomy, potential PEG, and hemodialysis. This was explained in depth to the patient's family previously. We will plan for hemodialysis again tomorrow.     LOS:  Mosetta Pigeon 5/22/20176:17 PM

## 2015-10-29 ENCOUNTER — Encounter (HOSPITAL_COMMUNITY): Payer: Self-pay | Admitting: Certified Registered Nurse Anesthetist

## 2015-10-29 ENCOUNTER — Encounter: Admission: AD | Disposition: E | Payer: Self-pay | Source: Ambulatory Visit | Attending: Internal Medicine

## 2015-10-29 HISTORY — PX: TRACHEOSTOMY TUBE PLACEMENT: SHX814

## 2015-10-29 LAB — COMPREHENSIVE METABOLIC PANEL
ALK PHOS: 237 U/L — AB (ref 38–126)
ALT: 52 U/L (ref 17–63)
AST: 52 U/L — AB (ref 15–41)
Albumin: 1.8 g/dL — ABNORMAL LOW (ref 3.5–5.0)
Anion gap: 11 (ref 5–15)
BILIRUBIN TOTAL: 0.6 mg/dL (ref 0.3–1.2)
BUN: 67 mg/dL — AB (ref 6–20)
CALCIUM: 8.2 mg/dL — AB (ref 8.9–10.3)
CO2: 29 mmol/L (ref 22–32)
Chloride: 94 mmol/L — ABNORMAL LOW (ref 101–111)
Creatinine, Ser: 1.96 mg/dL — ABNORMAL HIGH (ref 0.61–1.24)
GFR calc Af Amer: 37 mL/min — ABNORMAL LOW (ref >60)
GFR, EST NON AFRICAN AMERICAN: 32 mL/min — AB (ref >60)
GLUCOSE: 118 mg/dL — AB (ref 65–99)
POTASSIUM: 3.7 mmol/L (ref 3.5–5.1)
Sodium: 134 mmol/L — ABNORMAL LOW (ref 135–145)
TOTAL PROTEIN: 6.7 g/dL (ref 6.5–8.1)

## 2015-10-29 LAB — PROTIME-INR
INR: 1.72 — ABNORMAL HIGH (ref 0.00–1.49)
PROTHROMBIN TIME: 20.1 s — AB (ref 11.6–15.2)

## 2015-10-29 LAB — CBC
HEMATOCRIT: 27.7 % — AB (ref 39.0–52.0)
HEMATOCRIT: 19.7 % — AB (ref 39.0–52.0)
HEMOGLOBIN: 6.2 g/dL — AB (ref 13.0–17.0)
Hemoglobin: 8.4 g/dL — ABNORMAL LOW (ref 13.0–17.0)
MCH: 25.7 pg — ABNORMAL LOW (ref 26.0–34.0)
MCH: 25.3 pg — AB (ref 26.0–34.0)
MCHC: 30.3 g/dL (ref 30.0–36.0)
MCHC: 30.5 g/dL (ref 30.0–36.0)
MCV: 84.7 fL (ref 78.0–100.0)
MCV: 83.1 fL (ref 78.0–100.0)
PLATELETS: 189 10*3/uL (ref 150–400)
Platelets: 156 10*3/uL (ref 150–400)
RBC: 3.27 MIL/uL — ABNORMAL LOW (ref 4.22–5.81)
RBC: 2.37 MIL/uL — AB (ref 4.22–5.81)
RDW: 17.4 % — AB (ref 11.5–15.5)
RDW: 17.4 % — ABNORMAL HIGH (ref 11.5–15.5)
WBC: 11.5 10*3/uL — AB (ref 4.0–10.5)
WBC: 10.8 10*3/uL — ABNORMAL HIGH (ref 4.0–10.5)

## 2015-10-29 LAB — HEMOGLOBIN AND HEMATOCRIT, BLOOD
HEMATOCRIT: 24.3 % — AB (ref 39.0–52.0)
HEMOGLOBIN: 7.5 g/dL — AB (ref 13.0–17.0)

## 2015-10-29 LAB — APTT: aPTT: 34 seconds (ref 24–37)

## 2015-10-29 LAB — ABO/RH: ABO/RH(D): A POS

## 2015-10-29 SURGERY — CREATION, TRACHEOSTOMY
Anesthesia: General | Site: Neck

## 2015-10-29 MED ORDER — SODIUM CHLORIDE 0.9 % IV SOLN
INTRAVENOUS | Status: DC | PRN
  Administered 2015-10-29 (×2): via INTRAVENOUS

## 2015-10-29 MED ORDER — ARTIFICIAL TEARS OP OINT
TOPICAL_OINTMENT | OPHTHALMIC | Status: DC | PRN
  Administered 2015-10-29: 1 via OPHTHALMIC

## 2015-10-29 MED ORDER — MIDAZOLAM HCL 2 MG/2ML IJ SOLN
INTRAMUSCULAR | Status: AC
  Filled 2015-10-29: qty 2

## 2015-10-29 MED ORDER — PROPOFOL 10 MG/ML IV BOLUS
INTRAVENOUS | Status: AC
  Filled 2015-10-29: qty 20

## 2015-10-29 MED ORDER — PHENYLEPHRINE HCL 10 MG/ML IJ SOLN
10.0000 mg | INTRAVENOUS | Status: DC | PRN
  Administered 2015-10-29: 50 ug/min via INTRAVENOUS

## 2015-10-29 MED ORDER — CEFAZOLIN SODIUM 1 G IJ SOLR
INTRAMUSCULAR | Status: AC
  Filled 2015-10-29: qty 10

## 2015-10-29 MED ORDER — 0.9 % SODIUM CHLORIDE (POUR BTL) OPTIME
TOPICAL | Status: DC | PRN
  Administered 2015-10-29: 1000 mL

## 2015-10-29 MED ORDER — FENTANYL CITRATE (PF) 250 MCG/5ML IJ SOLN
INTRAMUSCULAR | Status: AC
  Filled 2015-10-29: qty 5

## 2015-10-29 MED ORDER — CEFAZOLIN SODIUM 1-5 GM-% IV SOLN
INTRAVENOUS | Status: DC | PRN
  Administered 2015-10-29: 1 g via INTRAVENOUS

## 2015-10-29 MED ORDER — HEMOSTATIC AGENTS (NO CHARGE) OPTIME
TOPICAL | Status: DC | PRN
  Administered 2015-10-29: 1 via TOPICAL

## 2015-10-29 MED ORDER — SODIUM CHLORIDE 0.9 % IJ SOLN
INTRAMUSCULAR | Status: AC
  Filled 2015-10-29: qty 10

## 2015-10-29 MED ORDER — FENTANYL CITRATE (PF) 100 MCG/2ML IJ SOLN
INTRAMUSCULAR | Status: DC | PRN
  Administered 2015-10-29: 50 ug via INTRAVENOUS

## 2015-10-29 SURGICAL SUPPLY — 29 items
CLEANER TIP ELECTROSURG 2X2 (MISCELLANEOUS) ×3 IMPLANT
COVER SURGICAL LIGHT HANDLE (MISCELLANEOUS) ×3 IMPLANT
ELECT COATED BLADE 2.86 ST (ELECTRODE) ×3 IMPLANT
GAUZE SPONGE 4X4 16PLY XRAY LF (GAUZE/BANDAGES/DRESSINGS) ×3 IMPLANT
GEL ULTRASOUND 20GR AQUASONIC (MISCELLANEOUS) ×3 IMPLANT
GLOVE SS BIOGEL STRL SZ 7.5 (GLOVE) ×1 IMPLANT
GLOVE SUPERSENSE BIOGEL SZ 7.5 (GLOVE) ×2
GOWN STRL REUS W/ TWL LRG LVL3 (GOWN DISPOSABLE) ×1 IMPLANT
GOWN STRL REUS W/ TWL XL LVL3 (GOWN DISPOSABLE) ×1 IMPLANT
GOWN STRL REUS W/TWL LRG LVL3 (GOWN DISPOSABLE) ×2
GOWN STRL REUS W/TWL XL LVL3 (GOWN DISPOSABLE) ×2
HOLDER TRACH TUBE VELCRO 19.5 (MISCELLANEOUS) ×3 IMPLANT
KIT BASIN OR (CUSTOM PROCEDURE TRAY) ×3 IMPLANT
KIT ROOM TURNOVER OR (KITS) ×3 IMPLANT
KIT SUCTION CATH 14FR (SUCTIONS) ×3 IMPLANT
NS IRRIG 1000ML POUR BTL (IV SOLUTION) ×3 IMPLANT
PACK EENT II TURBAN DRAPE (CUSTOM PROCEDURE TRAY) ×3 IMPLANT
PAD ARMBOARD 7.5X6 YLW CONV (MISCELLANEOUS) ×3 IMPLANT
PENCIL BUTTON HOLSTER BLD 10FT (ELECTRODE) ×3 IMPLANT
SPONGE DRAIN TRACH 4X4 STRL 2S (GAUZE/BANDAGES/DRESSINGS) ×3 IMPLANT
SPONGE INTESTINAL PEANUT (DISPOSABLE) ×3 IMPLANT
SUT SILK 2 0 SH CR/8 (SUTURE) ×3 IMPLANT
SUT SILK 3 0 TIES 10X30 (SUTURE) ×3 IMPLANT
SYR 20CC LL (SYRINGE) ×3 IMPLANT
SYR CONTROL 10ML LL (SYRINGE) ×3 IMPLANT
TOWEL OR 17X24 6PK STRL BLUE (TOWEL DISPOSABLE) ×3 IMPLANT
TOWEL OR 17X26 10 PK STRL BLUE (TOWEL DISPOSABLE) ×3 IMPLANT
TUBE CONNECTING 12'X1/4 (SUCTIONS) ×1
TUBE CONNECTING 12X1/4 (SUCTIONS) ×2 IMPLANT

## 2015-10-29 NOTE — Anesthesia Postprocedure Evaluation (Signed)
Anesthesia Post Note  Patient: Robert Patrick  Procedure(s) Performed: Procedure(s) (LRB): TRACHEOSTOMY (N/A)  Patient location during evaluation: PACU Anesthesia Type: General Level of consciousness: awake and alert Pain management: pain level controlled Vital Signs Assessment: post-procedure vital signs reviewed and stable Respiratory status: spontaneous breathing, nonlabored ventilation, respiratory function stable and patient connected to nasal cannula oxygen Cardiovascular status: blood pressure returned to baseline and stable Postop Assessment: no signs of nausea or vomiting Anesthetic complications: no    Last Vitals: There were no vitals filed for this visit.  Last Pain: There were no vitals filed for this visit.               Vinette Crites S

## 2015-10-29 NOTE — Brief Op Note (Signed)
09/25/2015 - 11-14-2015  9:24 PM  PATIENT:  Robert Patrick  75 y.o. male  PRE-OPERATIVE DIAGNOSIS:  bleeding around trach  POST-OPERATIVE DIAGNOSIS:  bleeding around trach  PROCEDURE:  Procedure(s):  EXPLORATION TRACHEOSTOMY (N/A)  SURGEON:  Surgeon(s) and Role:    * Drema Halonhristopher E Wildon Cuevas, MD - Primary  PHYSICIAN ASSISTANT:   ASSISTANTS: none   ANESTHESIA:   general  EBL:  Total I/O In: 1100 [I.V.:1100] Out: -   BLOOD ADMINISTERED:none  DRAINS: none   LOCAL MEDICATIONS USED:  NONE  SPECIMEN:  No Specimen  DISPOSITION OF SPECIMEN:  N/A  COUNTS:  YES  TOURNIQUET:  * No tourniquets in log *  DICTATION: .Other Dictation: Dictation Number [id number]  PLAN OF CARE: Discharge to home after PACU  PATIENT DISPOSITION:  PACU - hemodynamically stable.   Delay start of Pharmacological VTE agent (>24hrs) due to surgical blood loss or risk of bleeding: not applicable

## 2015-10-29 NOTE — Anesthesia Procedure Notes (Signed)
Date/Time: 07-May-2016 8:35 PM Performed by: Pricilla HolmBILOTTA, Satrina Magallanes Z Pre-anesthesia Checklist: Patient identified, Timeout performed, Emergency Drugs available, Suction available and Patient being monitored Patient Re-evaluated:Patient Re-evaluated prior to inductionOxygen Delivery Method: Circle system utilized Preoxygenation: Pre-oxygenation with 100% oxygen Intubation Type: Inhalational induction Comments: Airway controlled by Dr. Ezzard StandingNewman

## 2015-10-29 NOTE — Anesthesia Postprocedure Evaluation (Signed)
Anesthesia Post Note  Patient: Robert Patrick  Procedure(s) Performed: Procedure(s) (LRB):  EXPLORATION TRACHEOSTOMY (N/A)  Patient location during evaluation: ICU Anesthesia Type: General Level of consciousness: sedated Pain management: pain level controlled Vital Signs Assessment: post-procedure vital signs reviewed and stable Respiratory status: patient on ventilator - see flowsheet for VS Cardiovascular status: stable Anesthetic complications: no    Last Vitals: There were no vitals filed for this visit.  Last Pain: There were no vitals filed for this visit.               Shelton SilvasKevin D Sera Hitsman

## 2015-10-29 NOTE — Anesthesia Preprocedure Evaluation (Signed)
Anesthesia Evaluation  Patient identified by MRN, date of birth, ID band  Reviewed: Unable to perform ROS - Chart review onlyPreop documentation limited or incomplete due to emergent nature of procedure.  Airway Mallampati: Trach   Neck ROM: Limited    Dental  (+) Dental Advisory Given   Pulmonary     + decreased breath sounds      Cardiovascular + Peripheral Vascular Disease and +CHF   Rhythm:Regular Rate:Normal     Neuro/Psych negative neurological ROS  negative psych ROS   GI/Hepatic negative GI ROS, Neg liver ROS,   Endo/Other  diabetes  Renal/GU CRFRenal disease  negative genitourinary   Musculoskeletal negative musculoskeletal ROS (+)   Abdominal   Peds negative pediatric ROS (+)  Hematology negative hematology ROS (+)   Anesthesia Other Findings   Reproductive/Obstetrics negative OB ROS                             Anesthesia Physical Anesthesia Plan  ASA: III and emergent  Anesthesia Plan: General   Post-op Pain Management:    Induction: Intravenous  Airway Management Planned: Tracheostomy  Additional Equipment:   Intra-op Plan:   Post-operative Plan: Post-operative intubation/ventilation  Informed Consent: I have reviewed the patients History and Physical, chart, labs and discussed the procedure including the risks, benefits and alternatives for the proposed anesthesia with the patient or authorized representative who has indicated his/her understanding and acceptance.     Plan Discussed with: CRNA  Anesthesia Plan Comments:         Anesthesia Quick Evaluation

## 2015-10-29 NOTE — Transfer of Care (Signed)
Immediate Anesthesia Transfer of Care Note  Patient: Robert Patrick  Procedure(s) Performed: Procedure(s):  EXPLORATION TRACHEOSTOMY (N/A)  Patient Location: PACU and Patient transferred directly back to [facility]  Anesthesia Type:General  Level of Consciousness: sedated  Airway & Oxygen Therapy: Patient placed on Ventilator (see vital sign flow sheet for setting)  Post-op Assessment: Report given to RN  Post vital signs: Reviewed and stable  Last Vitals: There were no vitals filed for this visit.  Last Pain: There were no vitals filed for this visit.       Complications: No apparent anesthesia complications

## 2015-10-30 ENCOUNTER — Encounter (HOSPITAL_COMMUNITY): Payer: Self-pay | Admitting: Otolaryngology

## 2015-10-30 ENCOUNTER — Other Ambulatory Visit (HOSPITAL_COMMUNITY): Payer: Self-pay

## 2015-10-30 LAB — COMPREHENSIVE METABOLIC PANEL
ALBUMIN: 1.5 g/dL — AB (ref 3.5–5.0)
ALK PHOS: 204 U/L — AB (ref 38–126)
ALT: 47 U/L (ref 17–63)
AST: 55 U/L — AB (ref 15–41)
Anion gap: 9 (ref 5–15)
BUN: 54 mg/dL — AB (ref 6–20)
CALCIUM: 7.6 mg/dL — AB (ref 8.9–10.3)
CO2: 28 mmol/L (ref 22–32)
CREATININE: 1.83 mg/dL — AB (ref 0.61–1.24)
Chloride: 98 mmol/L — ABNORMAL LOW (ref 101–111)
GFR calc non Af Amer: 35 mL/min — ABNORMAL LOW (ref >60)
GFR, EST AFRICAN AMERICAN: 40 mL/min — AB (ref >60)
Glucose, Bld: 192 mg/dL — ABNORMAL HIGH (ref 65–99)
Potassium: 4.1 mmol/L (ref 3.5–5.1)
Sodium: 135 mmol/L (ref 135–145)
Total Bilirubin: 0.9 mg/dL (ref 0.3–1.2)
Total Protein: 5.3 g/dL — ABNORMAL LOW (ref 6.5–8.1)

## 2015-10-30 LAB — PREPARE RBC (CROSSMATCH)

## 2015-10-30 LAB — CBC
HCT: 22.7 % — ABNORMAL LOW (ref 39.0–52.0)
Hemoglobin: 7.1 g/dL — ABNORMAL LOW (ref 13.0–17.0)
MCH: 26.1 pg (ref 26.0–34.0)
MCHC: 31.3 g/dL (ref 30.0–36.0)
MCV: 83.5 fL (ref 78.0–100.0)
PLATELETS: 173 10*3/uL (ref 150–400)
RBC: 2.72 MIL/uL — AB (ref 4.22–5.81)
RDW: 16.6 % — AB (ref 11.5–15.5)
WBC: 12.1 10*3/uL — ABNORMAL HIGH (ref 4.0–10.5)

## 2015-10-30 NOTE — Op Note (Signed)
NAMHulan Patrick:  Hilger, [name]            ACCOUNT NO.:  [id number]  MEDICAL RECORD NO.:  [id number]  LOCATION:                                 FACILITY:  PHYSICIAN:  Kristine GarbeChristopher E. Ezzard StandingNewman, M.D.DATE OF BIRTH:  1941/04/23  DATE OF PROCEDURE:  10/13/2015 DATE OF DISCHARGE:                              OPERATIVE REPORT   PREOPERATIVE DIAGNOSIS:  Bleeding around tracheostomy.  POSTOPERATIVE DIAGNOSIS:  Bleeding around tracheostomy.  OPERATION: Examination of Tracheostomy Site with cauterization and packing of bleeding.  SURGEON:  Kristine GarbeChristopher E. Ezzard StandingNewman, M.D.  ANESTHESIA:  General.  COMPLICATIONS:  None.  BRIEF CLINICAL NOTE:  Robert FrayZachariah Patrick is a 75 year old gentleman who [facility].  He is status post a tracheostomy 5 days ago, had dialysis earlier today and developed abrupt bleeding this evening around 7 o'clock.  He had packing around the trach, but the stoma continues to bleed around the tracheotomy.  I saw the patient on the floor up at 5700 and the patient had a fairly brisk bright red bleeding coming up around the trach through the trachea stoma, could not adequately evaluate exactly where the bleeding arose from, but appeared to be more arterial and the patient was taken emergently to the operating room for cauterization of bleeding around tracheal stoma.  The patient was transferred directly from 5700 down to the operating room.  The patient remained in his bed and using suction, head light, the bleeding site was identified on the right side down close to the trachea in the region of the thyroid on the right side.  This was stopped with suction cautery as it was appeared to be arterial-type bleeding, fairly small, but fairly persistent.  After cauterization of the arterial bleeding, no other bleeding occurred.  The tracheostomy, which was an XLT proximal, #6 was removed and a 6.5 endotracheal tube was placed down through the stoma.  There was a  fair amount of blood clot that was suctioned out above the balloon.  The area where the bleeding arose was re-cauterized with suction cautery until there was no further bleeding and just scab and scar tissue.  A new #6 proximal XLT tracheostomy was then placed as the endotracheal tube was removed. There was no further bleeding.  Suction through the tracheostomy was fairly clear with minimal blood.  Three pieces of Surgicel were then packed around the stoma around the tracheostomy tube.  The tube was secured with Velcro tape around the neck and the patient was [facility].    ______________________________ Kristine Garbehristopher E. Ezzard StandingNewman, M.D.   ______________________________ Kristine Garbehristopher E. Ezzard StandingNewman, M.D.    CEN/MEDQ  D:  11/03/2015  T:  10/30/2015  Job:  [id number]

## 2015-10-30 NOTE — Progress Notes (Addendum)
   Name: Robert Patrick MRN: [id number] DOB: 10/01/1940    ADMISSION DATE:  09/29/2015 CONSULTATION DATE:  4/27  REFERRING MD :  Sharyon MedicusHIjazi  CHIEF COMPLAINT:  Vent management   BRIEF PATIENT DESCRIPTION: 75yo male with hx CHF, Ischemic cardiomyopathy (EF25%), AFib, CKD IV admitted to outside hospital with acute respiratory failure r/t aspiration PNA.  Course was c/b PEA arrest and subsequent anoxic brain injury.  He was tx 4/27 to [facility] for further vent weaning efforts.  Remains orally intubated, poor neurologic prognosis, deemed poor candidate for HD by nephrology.    SIGNIFICANT EVENTS  4/21  admitted for SOB 4/22  pt arrested. Anoxic injury. Intubated 4/26  [facility]  PCCM consulted  SUBJECTIVE: Bled from trach site overnight, ENT took back to the OR overnight to address.  Failed weaning.  VITAL SIGNS: 98.7, 78, 19, 145/75, 98%  PHYSICAL EXAMINATION: General:  Thin, chronically ill appearing male, NAD on vent Neuro:  Does not withdraw to pain, no corneal reflex, no cough to suctioning; will open eyes spontaneously but not to command. Pulmonary: vent supported breaths, normal air entry. CV: RRR, No M/R/G, Nl S1/S2. GI: BS+, soft, NT, ND. MSK: diminished bulk and tone  Recent Labs Lab 10/28/15 0640 10/08/2015 0056 10/30/15 0712  NA 136 134* 135  K 3.7 3.7 4.1  CL 96* 94* 98*  CO2 30 29 28   BUN 55* 67* 54*  CREATININE 1.68* 1.96* 1.83*  GLUCOSE 92 118* 192*    Recent Labs Lab 10/31/2015 0056 11/03/2015 2044 10/27/2015 2307 10/30/15 0712  HGB 8.4* 7.5* 6.2* 7.1*  HCT 27.7* 24.3* 19.7* 22.7*  WBC 11.5*  --  10.8* 12.1*  PLT 189  --  156 173   I reviewed CT of the abdomen myself, bilateral L>R pleural effusion.  ASSESSMENT / PLAN:  Acute respiratory failure - r/t aspiration PNA initially, now c/b PEA arrest with subsequent anoxic injury.  Remains orally intubated, vent dependent.  Unable to wean/extubate r/t poor mental status.     Bilateral Pleural Effusions  PLAN: -Continue to attempt weaning, doubt will make any progress. -Keep trach in place. -Needs PEG at this time for placement in a vent-SNF if family continues to wish for full care. -Intermittent f/u CXR -Abx per primary -No extubation given neuro status.  CKD IV  Ischemic cardiomyopathy - EF 25% -- previously deemed not AICD candidate r/t poor compliance  DM  AFib   Anoxic encephalopathy: Given his severe anoxic brain injury and comorbid illnesses, he will not survive this illness.  Patient's neuro exam is bismal at best.    Needs GOC discussion, family continues to wish for full code status.  Discussed with RT.  Alyson ReedyWesam G. Brisha Mccabe, M.D. [facility]. Pager: [phone]. After hours pager: [phone].

## 2015-10-30 NOTE — Progress Notes (Signed)
Subjective:    Last dialysis 5/23 Net fluid removed was 1500 cc.  Objective:  Vital signs in last 24 hours:  Temperature 98.2, pulse 102, respirations 18, blood pressure 106/53   Physical Exam: General: Critically ill-appearing  HEENT [place]/AT eyes open NG in place  Neck Trach in place  Pulm/lungs Ventilator dependent, bilateral rhonchi  CVS/Heart Irregular, A Fib  Abdomen:  Soft, nondistended, BS present  Extremities: + peripheral edema, RLE amputation noted  Neurologic: Not following commands, eyes open, doesn't track  Skin: No rashes  Access: Right arm AV graft, bruit present       Basic Metabolic Panel:   Recent Labs Lab 10/07/2015 0625  10/26/15 0751 10/27/15 0629 10/28/15 0640 11/03/2015 0056 10/30/15 0712  NA 137  137  < > 136 134* 136 134* 135  K 4.0  4.0  < > 3.9 3.7 3.7 3.7 4.1  CL 95*  95*  < > 96* 94* 96* 94* 98*  CO2 30  28  < > GLUCOSE 57*  55*  < > 103* 65 92 118* 192*  BUN 95*  94*  < > 81* 38* 55* 67* 54*  CREATININE 1.97*  1.96*  < > 1.87* 1.20 1.68* 1.96* 1.83*  CALCIUM 8.4*  8.4*  < > 8.3* 8.2* 8.3* 8.2* 7.6*  PHOS 3.4  --  3.5  --   --   --   --   < > = values in this interval not displayed.   CBC:  Recent Labs Lab 10/27/15 0629 10/28/15 0640 10/17/2015 0056 10/30/2015 2044 10/09/2015 2307 10/30/15 0712  WBC 9.5 11.4* 11.5*  --  10.8* 12.1*  HGB 8.4* 8.4* 8.4* 7.5* 6.2* 7.1*  HCT 28.7* 27.4* 27.7* 24.3* 19.7* 22.7*  MCV 85.2 82.8 84.7  --  83.1 83.5  PLT 150 166 189  --  156 173      Microbiology:  Recent Results (from the past 720 hour(s))  Urine culture     Status: Abnormal   Collection Time: 10/19/15 10:00 AM  Result Value Ref Range Status   Specimen Description URINE, RANDOM  Final   Special Requests NONE  Final   Culture >=100,000 COLONIES/mL YEAST (A)  Final   Report Status 10/20/2015 FINAL  Final    Coagulation Studies:  Recent Labs  10/19/2015 2307  LABPROT 20.1*  INR 1.72*     Urinalysis: No results for input(s): COLORURINE, LABSPEC, PHURINE, GLUCOSEU, HGBUR, BILIRUBINUR, KETONESUR, PROTEINUR, UROBILINOGEN, NITRITE, LEUKOCYTESUR in the last 72 hours.  Invalid input(s): APPERANCEUR    Imaging: Ct Abdomen Wo Contrast  10/30/2015  CLINICAL DATA:  Evaluate anatomy prior to potential percutaneous gastrostomy tube placement EXAM: CT ABDOMEN WITHOUT CONTRAST TECHNIQUE: Multidetector CT imaging of the abdomen was performed following the standard protocol without IV contrast. COMPARISON:  None. FINDINGS: Lower chest: Limited visualization of the lower thorax demonstrates moderate left and small partially loculated bilateral pleural effusions, left greater than right. Bibasilar dependent slightly nodular airspace opacities, nonspecific, potentially represent of areas of rounded atelectasis (representative image 3, series 205). Cardiomegaly. Coronary artery calcifications. There is diffuse decreased attenuation intra cardiac blood pool compatible with anemia. Hepatobiliary: Mild nodularity hepatic contour. Post cholecystectomy. No ascites. Pancreas: Normal noncontrast appearance of the pancreas. Spleen: Normal noncontrast appearance of the spleen Adrenals/Urinary Tract: Calcifications about the bilateral renal hila are favored to be vascular in etiology. No definite renal stones. There is a minimal amount of low grossly symmetric bilateral likely age and body habitus  related perinephric stranding. No urinary obstruction. Normal noncontrast appearance of bilateral adrenal glands. Stomach/Bowel: There is an adequate window to allow for percutaneous gastrostomy tube placement (representative axial image 36, series 201, sagittal image 102, series 205). Large colonic stool burden without evidence of enteric obstruction. Vascular/Lymphatic: Scattered atherosclerotic plaque within a tortuous but normal caliber abdominal aorta. No bulky mesenteric or retroperitoneal adenopathy within the  imaged abdomen. Other: Mild diffuse body wall anasarca suggestive of third spacing. Musculoskeletal: No acute or aggressive osseous abnormalities. Stigmata of DISH within the thoracic spine. IMPRESSION: 1. Adequate window to allow for attempted percutaneous gastrostomy tube placement. 2. Moderate left and small right-sided pleural effusions with cardiomegaly, anemia and findings suggestive of body wall edema. Constellation of findings worrisome for pulmonary edema. 3. Subpleural slightly nodular airspace opacities within the imaged bilateral lung bases, nonspecific and while potentially infectious etiology, rounded atelectasis could have a similar appearance. Clinical correlation is advised. Electronically Signed   By: Simonne ComeJohn  Watts M.D.   On: 10/30/2015 16:39     Medications:       Assessment/ Plan:  75 y.o. male with a PMHx of severe ischemic cardiopathy with ejection fraction of 25%, atrial fibrillation, congestive heart failure, chronic kidney disease stage IV, recent acute respiratory failure secondary to aspiration pneumonia, recent PEA arrest with anoxic brain injury, who was admitted to [facility] on 09/16/2015 for ongoing weaning efforts.   1. Acute renal failure. with RUE AVG. Dialysis T-T-S schedule 2. CKD stage IV. 3. Recent PEA arrest with anoxic brain injury. 4. Acute Respiratory failure. 5. Anemia of CKD. 6. Secondary hyperparathyroidism. 7. Seizure disorder noted 10/04/15.  Plan: Patient had tracheostomy placed in the setting of poor overall neurologic function. Family has requested that we continue dialysis at this time. As before he will not be outpatient dialysis candidate. Therefore he will likely need to go to a facility that can accommodate his tracheostomy, potential PEG, and hemodialysis. This was explained in depth to the patient's family previously. We will plan for hemodialysis again tomorrow.   Check iron studies with next dialysis session. If adequate,  will start low dose Procrit   LOS:  Robert Patrick 5/24/20174:44 PM

## 2015-10-31 ENCOUNTER — Encounter: Payer: Self-pay | Admitting: General Surgery

## 2015-10-31 LAB — IRON AND TIBC
IRON: 31 ug/dL — AB (ref 45–182)
SATURATION RATIOS: 20 % (ref 17.9–39.5)
TIBC: 153 ug/dL — AB (ref 250–450)
UIBC: 122 ug/dL

## 2015-10-31 LAB — COMPREHENSIVE METABOLIC PANEL
ALK PHOS: 213 U/L — AB (ref 38–126)
ALT: 39 U/L (ref 17–63)
AST: 47 U/L — ABNORMAL HIGH (ref 15–41)
Albumin: 1.6 g/dL — ABNORMAL LOW (ref 3.5–5.0)
Anion gap: 9 (ref 5–15)
BILIRUBIN TOTAL: 0.7 mg/dL (ref 0.3–1.2)
BUN: 69 mg/dL — ABNORMAL HIGH (ref 6–20)
CALCIUM: 7.9 mg/dL — AB (ref 8.9–10.3)
CO2: 27 mmol/L (ref 22–32)
CREATININE: 2.01 mg/dL — AB (ref 0.61–1.24)
Chloride: 100 mmol/L — ABNORMAL LOW (ref 101–111)
GFR, EST AFRICAN AMERICAN: 36 mL/min — AB (ref >60)
GFR, EST NON AFRICAN AMERICAN: 31 mL/min — AB (ref >60)
Glucose, Bld: 136 mg/dL — ABNORMAL HIGH (ref 65–99)
Potassium: 3.5 mmol/L (ref 3.5–5.1)
SODIUM: 136 mmol/L (ref 135–145)
Total Protein: 5.6 g/dL — ABNORMAL LOW (ref 6.5–8.1)

## 2015-10-31 LAB — FERRITIN: Ferritin: 1443 ng/mL — ABNORMAL HIGH (ref 24–336)

## 2015-10-31 LAB — RENAL FUNCTION PANEL
ALBUMIN: 1.6 g/dL — AB (ref 3.5–5.0)
ANION GAP: 7 (ref 5–15)
BUN: 70 mg/dL — ABNORMAL HIGH (ref 6–20)
CALCIUM: 7.8 mg/dL — AB (ref 8.9–10.3)
CO2: 27 mmol/L (ref 22–32)
Chloride: 100 mmol/L — ABNORMAL LOW (ref 101–111)
Creatinine, Ser: 1.98 mg/dL — ABNORMAL HIGH (ref 0.61–1.24)
GFR, EST AFRICAN AMERICAN: 37 mL/min — AB (ref >60)
GFR, EST NON AFRICAN AMERICAN: 32 mL/min — AB (ref >60)
Glucose, Bld: 132 mg/dL — ABNORMAL HIGH (ref 65–99)
PHOSPHORUS: 3.5 mg/dL (ref 2.5–4.6)
Potassium: 3.4 mmol/L — ABNORMAL LOW (ref 3.5–5.1)
SODIUM: 134 mmol/L — AB (ref 135–145)

## 2015-10-31 LAB — CBC
HEMATOCRIT: 20.1 % — AB (ref 39.0–52.0)
HEMOGLOBIN: 6.3 g/dL — AB (ref 13.0–17.0)
MCH: 26.4 pg (ref 26.0–34.0)
MCHC: 31.3 g/dL (ref 30.0–36.0)
MCV: 84.1 fL (ref 78.0–100.0)
Platelets: 169 10*3/uL (ref 150–400)
RBC: 2.39 MIL/uL — AB (ref 4.22–5.81)
RDW: 16.7 % — ABNORMAL HIGH (ref 11.5–15.5)
WBC: 11.8 10*3/uL — ABNORMAL HIGH (ref 4.0–10.5)

## 2015-10-31 LAB — TRANSFERRIN: Transferrin: 109 mg/dL — ABNORMAL LOW (ref 180–329)

## 2015-10-31 LAB — PROTIME-INR
INR: 1.55 — AB (ref 0.00–1.49)
Prothrombin Time: 18.7 seconds — ABNORMAL HIGH (ref 11.6–15.2)

## 2015-10-31 LAB — PREPARE RBC (CROSSMATCH)

## 2015-10-31 NOTE — Consult Note (Signed)
Chief Complaint: VDRF, needs g-tube  Referring Physician: Dr. Merton Border  Supervising Physician: Markus Daft  Patient Status: In-pt  HPI: Robert Patrick is an 75 y.o. male with complex PMH who was admitted to [facility] on 4/27 for ventilator management.  He was admitted to an outside facility secondary to respiratory failure from PNA.  He subsequently had a PEA arrest with anoxic brain injury.  He is unable to be weaned at this time secondary to his neurologic status, but family wants to push forward with care.  A g-tube is currently requested.  He did have acute arterial bleeding from his trach yesterday and his hgb today is 6.3.  He is being transfused.    Past Medical History:  Past Medical History  Diagnosis Date  . CHF (congestive heart failure) ([place])   . Ischemic cardiomyopathy     EF 25%  . CKD (chronic kidney disease), stage IV ([place])   . Diabetes ([place])   . PAD (peripheral artery disease) ([place])   . Right BKA infection [facility])     Past Surgical History:  Past Surgical History  Procedure Laterality Date  . Tracheostomy tube placement N/A 10/22/2015    Procedure: TRACHEOSTOMY;  Surgeon: Rozetta Nunnery, MD;  Location: [place];  Service: ENT;  Laterality: N/A;  . Tracheostomy tube placement N/A 10/17/2015    Procedure:  EXPLORATION TRACHEOSTOMY;  Surgeon: Rozetta Nunnery, MD;  Location: [place];  Service: ENT;  Laterality: N/A;    Family History: History reviewed. No pertinent family history.  Social History:  has no tobacco, alcohol, and drug history on file.  Allergies: Not on File  Medications:   Medication List    Notice    You have not been prescribed any medications.      Unable to obtain ROS secondary to poor neurologic status and vent dependent   Mallampati Score: MD Evaluation Airway: Other (comments) Airway comments: ETT in place Heart: WNL Abdomen: WNL Chest/ Lungs: WNL ASA  Classification: 4  Physical Exam: There were no vitals taken for  this visit. There is no height or weight on file to calculate BMI. General: critically ill black male who is laying in bed in NAD HEENT: head is normocephalic, atraumatic.  Sclera are noninjected.  Ears and nose without any masses or lesions, but PANDA tube in place.  Mouth is pink.  Lurline Idol is in place with no active bleeding. Heart: unable to auscultate heart sounds secondary to coarse BS.  Monitor shows an irregular rhythm.  Palpable radial and pedal pulses bilaterally, except RLE as he has a BKA Lungs: coarse breath sounds throughout.  Respiratory effort nonlabored on ventilator Abd: soft, mild distention, +BS, no masses, hernias, or organomegaly MS: all 4 extremities are symmetrical with no cyanosis, clubbing, or edema, except RLE with a BKA. Psych: unable secondary to anoxic brain injury   Labs: Results for orders placed or performed during the hospital encounter of 09/26/2015 (from the past 48 hour(s))  Hemoglobin and hematocrit, blood     Status: Abnormal   Collection Time: 11/01/2015  8:44 PM  Result Value Ref Range   Hemoglobin 7.5 (L) 13.0 - 17.0 g/dL   HCT 24.3 (L) 39.0 - 52.0 %  Type and screen [facility]     Status: None (Preliminary result)   Collection Time: 10/23/2015  8:45 PM  Result Value Ref Range   ABO/RH(D) A POS    Antibody Screen NEG    Sample Expiration 11/01/2015  Unit Number [id number]    Blood Component Type RED CELLS,LR    Unit division 00    Status of Unit ISSUED    Transfusion Status OK TO TRANSFUSE    Crossmatch Result Compatible    Unit Number [id number]    Blood Component Type RED CELLS,LR    Unit division 00    Status of Unit ISSUED,FINAL    Transfusion Status OK TO TRANSFUSE    Crossmatch Result Compatible    Unit Number [id number]    Blood Component Type RED CELLS,LR    Unit division 00    Status of Unit ALLOCATED    Transfusion Status OK TO TRANSFUSE    Crossmatch Result Compatible   ABO/Rh     Status: None   Collection  Time: 10/23/2015  8:45 PM  Result Value Ref Range   ABO/RH(D) A POS   Prepare RBC (crossmatch)     Status: None   Collection Time: 10/22/2015 11:07 PM  Result Value Ref Range   Order Confirmation ORDER PROCESSED BY BLOOD BANK   APTT     Status: None   Collection Time: 10/16/2015 11:07 PM  Result Value Ref Range   aPTT 34 24 - 37 seconds  CBC     Status: Abnormal   Collection Time: 10/08/2015 11:07 PM  Result Value Ref Range   WBC 10.8 (H) 4.0 - 10.5 K/uL   RBC 2.37 (L) 4.22 - 5.81 MIL/uL   Hemoglobin 6.2 (LL) 13.0 - 17.0 g/dL    Comment: REPEATED TO VERIFY CRITICAL RESULT CALLED TO, READ BACK BY AND VERIFIED WITH: K GILLESPE,RN 2329 10/18/2015 D BRADLEY    HCT 19.7 (L) 39.0 - 52.0 %   MCV 83.1 78.0 - 100.0 fL   MCH 25.3 (L) 26.0 - 34.0 pg   MCHC 30.5 30.0 - 36.0 g/dL   RDW 17.4 (H) 11.5 - 15.5 %   Platelets 156 150 - 400 K/uL  Protime-INR     Status: Abnormal   Collection Time: 10/07/2015 11:07 PM  Result Value Ref Range   Prothrombin Time 20.1 (H) 11.6 - 15.2 seconds   INR 1.72 (H) 0.00 - 1.49  Comprehensive metabolic panel     Status: Abnormal   Collection Time: 10/30/15  7:12 AM  Result Value Ref Range   Sodium 135 135 - 145 mmol/L   Potassium 4.1 3.5 - 5.1 mmol/L   Chloride 98 (L) 101 - 111 mmol/L   CO2 28 22 - 32 mmol/L   Glucose, Bld 192 (H) 65 - 99 mg/dL   BUN 54 (H) 6 - 20 mg/dL   Creatinine, Ser 1.83 (H) 0.61 - 1.24 mg/dL   Calcium 7.6 (L) 8.9 - 10.3 mg/dL   Total Protein 5.3 (L) 6.5 - 8.1 g/dL   Albumin 1.5 (L) 3.5 - 5.0 g/dL   AST 55 (H) 15 - 41 U/L   ALT 47 17 - 63 U/L   Alkaline Phosphatase 204 (H) 38 - 126 U/L   Total Bilirubin 0.9 0.3 - 1.2 mg/dL   GFR calc non Af Amer 35 (L) >60 mL/min   GFR calc Af Amer 40 (L) >60 mL/min    Comment: (NOTE) The eGFR has been calculated using the CKD EPI equation. This calculation has not been validated in all clinical situations. eGFR's persistently <60 mL/min signify possible Chronic Kidney Disease.    Anion gap 9 5 - 15    CBC     Status: Abnormal   Collection Time: 10/30/15  7:12 AM  Result Value Ref Range   WBC 12.1 (H) 4.0 - 10.5 K/uL   RBC 2.72 (L) 4.22 - 5.81 MIL/uL   Hemoglobin 7.1 (L) 13.0 - 17.0 g/dL   HCT 22.7 (L) 39.0 - 52.0 %   MCV 83.5 78.0 - 100.0 fL   MCH 26.1 26.0 - 34.0 pg   MCHC 31.3 30.0 - 36.0 g/dL   RDW 16.6 (H) 11.5 - 15.5 %   Platelets 173 150 - 400 K/uL  CBC     Status: Abnormal   Collection Time: 10/31/15  7:36 AM  Result Value Ref Range   WBC 11.8 (H) 4.0 - 10.5 K/uL   RBC 2.39 (L) 4.22 - 5.81 MIL/uL   Hemoglobin 6.3 (LL) 13.0 - 17.0 g/dL    Comment: REPEATED TO VERIFY CRITICAL RESULT CALLED TO, READ BACK BY AND VERIFIED WITH: S SAPKT,RN AT 4580 10/31/15 BY K BARR    HCT 20.1 (L) 39.0 - 52.0 %   MCV 84.1 78.0 - 100.0 fL   MCH 26.4 26.0 - 34.0 pg   MCHC 31.3 30.0 - 36.0 g/dL   RDW 16.7 (H) 11.5 - 15.5 %   Platelets 169 150 - 400 K/uL  Iron and TIBC     Status: Abnormal   Collection Time: 10/31/15  7:36 AM  Result Value Ref Range   Iron 31 (L) 45 - 182 ug/dL   TIBC 153 (L) 250 - 450 ug/dL   Saturation Ratios 20 17.9 - 39.5 %   UIBC 122 ug/dL  Ferritin     Status: Abnormal   Collection Time: 10/31/15  7:36 AM  Result Value Ref Range   Ferritin 1443 (H) 24 - 336 ng/mL  Transferrin     Status: Abnormal   Collection Time: 10/31/15  7:36 AM  Result Value Ref Range   Transferrin 109 (L) 180 - 329 mg/dL  Comprehensive metabolic panel     Status: Abnormal   Collection Time: 10/31/15  7:36 AM  Result Value Ref Range   Sodium 136 135 - 145 mmol/L   Potassium 3.5 3.5 - 5.1 mmol/L   Chloride 100 (L) 101 - 111 mmol/L   CO2 27 22 - 32 mmol/L   Glucose, Bld 136 (H) 65 - 99 mg/dL   BUN 69 (H) 6 - 20 mg/dL   Creatinine, Ser 2.01 (H) 0.61 - 1.24 mg/dL   Calcium 7.9 (L) 8.9 - 10.3 mg/dL   Total Protein 5.6 (L) 6.5 - 8.1 g/dL   Albumin 1.6 (L) 3.5 - 5.0 g/dL   AST 47 (H) 15 - 41 U/L   ALT 39 17 - 63 U/L   Alkaline Phosphatase 213 (H) 38 - 126 U/L   Total Bilirubin 0.7 0.3  - 1.2 mg/dL   GFR calc non Af Amer 31 (L) >60 mL/min   GFR calc Af Amer 36 (L) >60 mL/min    Comment: (NOTE) The eGFR has been calculated using the CKD EPI equation. This calculation has not been validated in all clinical situations. eGFR's persistently <60 mL/min signify possible Chronic Kidney Disease.    Anion gap 9 5 - 15  Protime-INR     Status: Abnormal   Collection Time: 10/31/15  7:36 AM  Result Value Ref Range   Prothrombin Time 18.7 (H) 11.6 - 15.2 seconds   INR 1.55 (H) 0.00 - 1.49  Renal function panel     Status: Abnormal   Collection Time: 10/31/15  7:39 AM  Result Value Ref Range   Sodium 134 (  L) 135 - 145 mmol/L   Potassium 3.4 (L) 3.5 - 5.1 mmol/L   Chloride 100 (L) 101 - 111 mmol/L   CO2 27 22 - 32 mmol/L   Glucose, Bld 132 (H) 65 - 99 mg/dL   BUN 70 (H) 6 - 20 mg/dL   Creatinine, Ser 1.98 (H) 0.61 - 1.24 mg/dL   Calcium 7.8 (L) 8.9 - 10.3 mg/dL   Phosphorus 3.5 2.5 - 4.6 mg/dL   Albumin 1.6 (L) 3.5 - 5.0 g/dL   GFR calc non Af Amer 32 (L) >60 mL/min   GFR calc Af Amer 37 (L) >60 mL/min    Comment: (NOTE) The eGFR has been calculated using the CKD EPI equation. This calculation has not been validated in all clinical situations. eGFR's persistently <60 mL/min signify possible Chronic Kidney Disease.    Anion gap 7 5 - 15  Prepare RBC     Status: None   Collection Time: 10/31/15  8:48 AM  Result Value Ref Range   Order Confirmation ORDER PROCESSED BY BLOOD BANK     Imaging: Ct Abdomen Wo Contrast  10/30/2015  CLINICAL DATA:  Evaluate anatomy prior to potential percutaneous gastrostomy tube placement EXAM: CT ABDOMEN WITHOUT CONTRAST TECHNIQUE: Multidetector CT imaging of the abdomen was performed following the standard protocol without IV contrast. COMPARISON:  None. FINDINGS: Lower chest: Limited visualization of the lower thorax demonstrates moderate left and small partially loculated bilateral pleural effusions, left greater than right. Bibasilar  dependent slightly nodular airspace opacities, nonspecific, potentially represent of areas of rounded atelectasis (representative image 3, series 205). Cardiomegaly. Coronary artery calcifications. There is diffuse decreased attenuation intra cardiac blood pool compatible with anemia. Hepatobiliary: Mild nodularity hepatic contour. Post cholecystectomy. No ascites. Pancreas: Normal noncontrast appearance of the pancreas. Spleen: Normal noncontrast appearance of the spleen Adrenals/Urinary Tract: Calcifications about the bilateral renal hila are favored to be vascular in etiology. No definite renal stones. There is a minimal amount of low grossly symmetric bilateral likely age and body habitus related perinephric stranding. No urinary obstruction. Normal noncontrast appearance of bilateral adrenal glands. Stomach/Bowel: There is an adequate window to allow for percutaneous gastrostomy tube placement (representative axial image 36, series 201, sagittal image 102, series 205). Large colonic stool burden without evidence of enteric obstruction. Vascular/Lymphatic: Scattered atherosclerotic plaque within a tortuous but normal caliber abdominal aorta. No bulky mesenteric or retroperitoneal adenopathy within the imaged abdomen. Other: Mild diffuse body wall anasarca suggestive of third spacing. Musculoskeletal: No acute or aggressive osseous abnormalities. Stigmata of DISH within the thoracic spine. IMPRESSION: 1. Adequate window to allow for attempted percutaneous gastrostomy tube placement. 2. Moderate left and small right-sided pleural effusions with cardiomegaly, anemia and findings suggestive of body wall edema. Constellation of findings worrisome for pulmonary edema. 3. Subpleural slightly nodular airspace opacities within the imaged bilateral lung bases, nonspecific and while potentially infectious etiology, rounded atelectasis could have a similar appearance. Clinical correlation is advised. Electronically Signed    By: Sandi Mariscal M.D.   On: 10/30/2015 16:39    Assessment/Plan 1. VDRF, needs g-tube -patient needs to be transitioned to vent-SNF as his family would still like to pursue care for the patient.  Therefore, he is in need of a g-tube. -we will recheck his CBC and PT/INR to make sure his hgb and INR are ok to proceed with the procedure. -I attempted to give his daughter a call, who signs consent for him. I am unable to reach her.  I will have the nursing  staff give her a call to obtain consent for this procedure -hold TFs after midnight tonight for possible procedure tomorrow.  Thank you for this interesting consult.  I greatly enjoyed meeting Rhyland Hinderliter and look forward to participating in their care.  A copy of this report was sent to the requesting provider on this date.  Electronically Signed: Henreitta Cea 10/31/2015, 2:07 PM   I spent a total of 40 Minutes   in face to face in clinical consultation, greater than 50% of which was counseling/coordinating care for VDRF, needs g-tube

## 2015-11-01 ENCOUNTER — Other Ambulatory Visit (HOSPITAL_COMMUNITY): Payer: Self-pay

## 2015-11-01 LAB — CBC
HCT: 22.5 % — ABNORMAL LOW (ref 39.0–52.0)
HEMOGLOBIN: 7.1 g/dL — AB (ref 13.0–17.0)
MCH: 26.7 pg (ref 26.0–34.0)
MCHC: 31.6 g/dL (ref 30.0–36.0)
MCV: 84.6 fL (ref 78.0–100.0)
PLATELETS: 184 10*3/uL (ref 150–400)
RBC: 2.66 MIL/uL — ABNORMAL LOW (ref 4.22–5.81)
RDW: 16.4 % — AB (ref 11.5–15.5)
WBC: 11.9 10*3/uL — ABNORMAL HIGH (ref 4.0–10.5)

## 2015-11-01 LAB — COMPREHENSIVE METABOLIC PANEL
ALBUMIN: 1.6 g/dL — AB (ref 3.5–5.0)
ALK PHOS: 215 U/L — AB (ref 38–126)
ALT: 35 U/L (ref 17–63)
AST: 42 U/L — AB (ref 15–41)
Anion gap: 9 (ref 5–15)
BILIRUBIN TOTAL: 0.9 mg/dL (ref 0.3–1.2)
BUN: 38 mg/dL — AB (ref 6–20)
CALCIUM: 7.9 mg/dL — AB (ref 8.9–10.3)
CO2: 29 mmol/L (ref 22–32)
CREATININE: 1.59 mg/dL — AB (ref 0.61–1.24)
Chloride: 100 mmol/L — ABNORMAL LOW (ref 101–111)
GFR calc Af Amer: 48 mL/min — ABNORMAL LOW (ref >60)
GFR calc non Af Amer: 41 mL/min — ABNORMAL LOW (ref >60)
GLUCOSE: 129 mg/dL — AB (ref 65–99)
Potassium: 3.2 mmol/L — ABNORMAL LOW (ref 3.5–5.1)
SODIUM: 138 mmol/L (ref 135–145)
TOTAL PROTEIN: 5.8 g/dL — AB (ref 6.5–8.1)

## 2015-11-01 LAB — PREPARE RBC (CROSSMATCH)

## 2015-11-01 LAB — PROTIME-INR
INR: 1.58 — AB (ref 0.00–1.49)
PROTHROMBIN TIME: 18.9 s — AB (ref 11.6–15.2)

## 2015-11-01 NOTE — Progress Notes (Addendum)
Subjective:    Last dialysis 5/25 Net fluid removed was 1500 cc. Patient still not responding Vent assisted fio2 25%/PEEP 5  Objective:  Vital signs in last 24 hours:  Temperature 99.2, pulse 79, respirations 22, blood pressure 130/62   Physical Exam: General: Critically ill-appearing  HEENT [place]/AT eyes open NG in place  Neck Trach in place  Pulm/lungs Ventilator dependent,   CVS/Heart Irregular, A Fib  Abdomen:  Soft, nondistended, BS present  Extremities: + peripheral edema, RLE amputation   Neurologic: Not following commands, eyes open, doesn't track  Skin: No rashes  Access: Right arm AV graft, bruit present       Basic Metabolic Panel:   Recent Labs Lab 10/26/15 0751  10/12/2015 0056 10/30/15 0712 10/31/15 0736 10/31/15 0739 11/01/15 0850  NA 136  < > 134* 135 136 134* 138  K 3.9  < > 3.7 4.1 3.5 3.4* 3.2*  CL 96*  < > 94* 98* 100* 100* 100*  CO2 29  < > 29 28 27 27 29   GLUCOSE 103*  < > 118* 192* 136* 132* 129*  BUN 81*  < > 67* 54* 69* 70* 38*  CREATININE 1.87*  < > 1.96* 1.83* 2.01* 1.98* 1.59*  CALCIUM 8.3*  < > 8.2* 7.6* 7.9* 7.8* 7.9*  PHOS 3.5  --   --   --   --  3.5  --   < > = values in this interval not displayed.   CBC:  Recent Labs Lab 10/22/2015 0056 10/08/2015 2044 10/08/2015 2307 10/30/15 0712 10/31/15 0736 11/01/15 0850  WBC 11.5*  --  10.8* 12.1* 11.8* 11.9*  HGB 8.4* 7.5* 6.2* 7.1* 6.3* 7.1*  HCT 27.7* 24.3* 19.7* 22.7* 20.1* 22.5*  MCV 84.7  --  83.1 83.5 84.1 84.6  PLT 189  --  156 173 169 184      Microbiology:  Recent Results (from the past 720 hour(s))  Urine culture     Status: Abnormal   Collection Time: 10/19/15 10:00 AM  Result Value Ref Range Status   Specimen Description URINE, RANDOM  Final   Special Requests NONE  Final   Culture >=100,000 COLONIES/mL YEAST (A)  Final   Report Status 10/20/2015 FINAL  Final    Coagulation Studies:  Recent Labs  10/08/2015 2307 10/31/15 0736 11/01/15 0850  LABPROT 20.1*  18.7* 18.9*  INR 1.72* 1.55* 1.58*    Urinalysis: No results for input(s): COLORURINE, LABSPEC, PHURINE, GLUCOSEU, HGBUR, BILIRUBINUR, KETONESUR, PROTEINUR, UROBILINOGEN, NITRITE, LEUKOCYTESUR in the last 72 hours.  Invalid input(s): APPERANCEUR    Imaging: No results found.   Medications:       Assessment/ Plan:  75 y.o. male with a PMHx of severe ischemic cardiopathy with ejection fraction of 25%, atrial fibrillation, congestive heart failure, chronic kidney disease stage IV, recent acute respiratory failure secondary to aspiration pneumonia, recent PEA arrest with anoxic brain injury, who was admitted to [facility] on 09/25/2015 for ongoing weaning efforts.   1. Acute renal failure. with RUE AVG. Dialysis M-W-F schedule 2. CKD stage IV. 3. Recent PEA arrest with anoxic brain injury. 4. Acute Respiratory failure. 5. Anemia of CKD. 6. Secondary hyperparathyroidism. 7. Seizure disorder noted 10/04/15.  Plan: Patient had tracheostomy placed in the setting of poor overall neurologic function. Family has requested that we continue dialysis at this time. As before he will not be outpatient dialysis candidate. Therefore he will likely need to go to a facility that can accommodate his tracheostomy, potential PEG, and  hemodialysis. This was explained in depth to the patient's family previously. t sat 20%.  consider low dose aranesp   LOS:  Robert Patrick 5/26/20175:58 PM

## 2015-11-01 NOTE — Progress Notes (Signed)
   Name: Robert Patrick MRN: [id number] DOB: September 21, 1940    ADMISSION DATE:  09/28/2015 CONSULTATION DATE:  4/27  REFERRING MD :  Sharyon MedicusHIjazi  CHIEF COMPLAINT:  Vent management   BRIEF PATIENT DESCRIPTION: 75yo male with hx CHF, Ischemic cardiomyopathy (EF25%), AFib, CKD IV admitted to outside hospital with acute respiratory failure r/t aspiration PNA.  Course was c/b PEA arrest and subsequent anoxic brain injury.  He was tx 4/27 to [facility] for further vent weaning efforts.  Remains orally intubated, poor neurologic prognosis, deemed poor candidate for HD by nephrology.    SIGNIFICANT EVENTS  4/21  admitted for SOB 4/22  pt arrested. Anoxic injury. Intubated 4/26  [facility]  PCCM consulted  SUBJECTIVE: No events overnight, failed weaning.  VITAL SIGNS: 97.9, 85, 18, 123/68, 98% on full vent support.  PHYSICAL EXAMINATION: General:  Thin, chronically ill appearing male, NAD on vent Neuro:  Does not withdraw to pain, no corneal reflex, no cough to suctioning; will open eyes spontaneously but not to command. Pulmonary: vent supported breaths, normal air entry. CV: RRR, No M/R/G, Nl S1/S2. GI: BS+, soft, NT, ND. MSK: diminished bulk and tone  Recent Labs Lab 10/31/15 0736 10/31/15 0739 11/01/15 0850  NA 136 134* 138  K 3.5 3.4* 3.2*  CL 100* 100* 100*  CO2 27 27 29   BUN 69* 70* 38*  CREATININE 2.01* 1.98* 1.59*  GLUCOSE 136* 132* 129*    Recent Labs Lab 10/30/15 0712 10/31/15 0736 11/01/15 0850  HGB 7.1* 6.3* 7.1*  HCT 22.7* 20.1* 22.5*  WBC 12.1* 11.8* 11.9*  PLT 173 169 184   I reviewed CT of the abdomen myself, bilateral L>R pleural effusion.  ASSESSMENT / PLAN:  Acute respiratory failure - r/t aspiration PNA initially, now c/b PEA arrest with subsequent anoxic injury.  Remains orally intubated, vent dependent.  Unable to wean/extubate r/t poor mental status.   Bilateral Pleural Effusions  PLAN: -No progress with weaning at  all, recommend vent SNF if family continues to wish for aggressive care. -Keep trach in place. -Intermittent f/u CXR -Abx per primary  CKD IV  Ischemic cardiomyopathy - EF 25% -- previously deemed not AICD candidate r/t poor compliance  DM  AFib   Anoxic encephalopathy: Given his severe anoxic brain injury and comorbid illnesses, he will not survive this illness.  Patient's neuro exam is bismal at best.    Needs GOC discussion, family continues to wish for full code status.  Discussed with RT.  Little else for PCCM to offer here, PCCM will sign off, please call back if needed.  Alyson ReedyWesam G. Yacoub, M.D. [facility]. Pager: [phone]. After hours pager: [phone].

## 2015-11-02 LAB — COMPREHENSIVE METABOLIC PANEL
ALT: 33 U/L (ref 17–63)
AST: 41 U/L (ref 15–41)
Albumin: 1.7 g/dL — ABNORMAL LOW (ref 3.5–5.0)
Alkaline Phosphatase: 211 U/L — ABNORMAL HIGH (ref 38–126)
Anion gap: 9 (ref 5–15)
BILIRUBIN TOTAL: 1.6 mg/dL — AB (ref 0.3–1.2)
BUN: 38 mg/dL — ABNORMAL HIGH (ref 6–20)
CHLORIDE: 100 mmol/L — AB (ref 101–111)
CO2: 26 mmol/L (ref 22–32)
CREATININE: 1.64 mg/dL — AB (ref 0.61–1.24)
Calcium: 7.7 mg/dL — ABNORMAL LOW (ref 8.9–10.3)
GFR, EST AFRICAN AMERICAN: 46 mL/min — AB (ref >60)
GFR, EST NON AFRICAN AMERICAN: 40 mL/min — AB (ref >60)
Glucose, Bld: 123 mg/dL — ABNORMAL HIGH (ref 65–99)
POTASSIUM: 3.2 mmol/L — AB (ref 3.5–5.1)
Sodium: 135 mmol/L (ref 135–145)
Total Protein: 5.9 g/dL — ABNORMAL LOW (ref 6.5–8.1)

## 2015-11-02 LAB — TYPE AND SCREEN
ABO/RH(D): A POS
ANTIBODY SCREEN: NEGATIVE
UNIT DIVISION: 0
UNIT DIVISION: 0
Unit division: 0

## 2015-11-02 LAB — CBC
HCT: 26.9 % — ABNORMAL LOW (ref 39.0–52.0)
HEMOGLOBIN: 8.4 g/dL — AB (ref 13.0–17.0)
MCH: 26.4 pg (ref 26.0–34.0)
MCHC: 31.2 g/dL (ref 30.0–36.0)
MCV: 84.6 fL (ref 78.0–100.0)
Platelets: 186 10*3/uL (ref 150–400)
RBC: 3.18 MIL/uL — AB (ref 4.22–5.81)
RDW: 15.5 % (ref 11.5–15.5)
WBC: 10 10*3/uL (ref 4.0–10.5)

## 2015-11-03 LAB — CBC
HCT: 24.5 % — ABNORMAL LOW (ref 39.0–52.0)
Hemoglobin: 7.8 g/dL — ABNORMAL LOW (ref 13.0–17.0)
MCH: 27.3 pg (ref 26.0–34.0)
MCHC: 31.8 g/dL (ref 30.0–36.0)
MCV: 85.7 fL (ref 78.0–100.0)
PLATELETS: 213 10*3/uL (ref 150–400)
RBC: 2.86 MIL/uL — AB (ref 4.22–5.81)
RDW: 15.7 % — AB (ref 11.5–15.5)
WBC: 10.5 10*3/uL (ref 4.0–10.5)

## 2015-11-03 LAB — COMPREHENSIVE METABOLIC PANEL
ALBUMIN: 1.4 g/dL — AB (ref 3.5–5.0)
ALK PHOS: 206 U/L — AB (ref 38–126)
ALT: 36 U/L (ref 17–63)
AST: 40 U/L (ref 15–41)
Anion gap: 9 (ref 5–15)
BUN: 39 mg/dL — AB (ref 6–20)
CALCIUM: 7.7 mg/dL — AB (ref 8.9–10.3)
CHLORIDE: 101 mmol/L (ref 101–111)
CO2: 26 mmol/L (ref 22–32)
CREATININE: 1.56 mg/dL — AB (ref 0.61–1.24)
GFR calc non Af Amer: 42 mL/min — ABNORMAL LOW (ref >60)
GFR, EST AFRICAN AMERICAN: 49 mL/min — AB (ref >60)
GLUCOSE: 159 mg/dL — AB (ref 65–99)
Potassium: 2.9 mmol/L — ABNORMAL LOW (ref 3.5–5.1)
SODIUM: 136 mmol/L (ref 135–145)
Total Bilirubin: 2 mg/dL — ABNORMAL HIGH (ref 0.3–1.2)
Total Protein: 5.5 g/dL — ABNORMAL LOW (ref 6.5–8.1)

## 2015-11-03 LAB — PROTIME-INR
INR: 1.6 — ABNORMAL HIGH (ref 0.00–1.49)
PROTHROMBIN TIME: 19.1 s — AB (ref 11.6–15.2)

## 2015-11-04 ENCOUNTER — Other Ambulatory Visit (HOSPITAL_COMMUNITY): Payer: Self-pay

## 2015-11-04 LAB — CBC
HEMATOCRIT: 26.6 % — AB (ref 39.0–52.0)
HEMOGLOBIN: 8.2 g/dL — AB (ref 13.0–17.0)
MCH: 26.8 pg (ref 26.0–34.0)
MCHC: 30.8 g/dL (ref 30.0–36.0)
MCV: 86.9 fL (ref 78.0–100.0)
Platelets: 222 10*3/uL (ref 150–400)
RBC: 3.06 MIL/uL — AB (ref 4.22–5.81)
RDW: 15.7 % — ABNORMAL HIGH (ref 11.5–15.5)
WBC: 12.2 10*3/uL — ABNORMAL HIGH (ref 4.0–10.5)

## 2015-11-04 LAB — RENAL FUNCTION PANEL
ANION GAP: 8 (ref 5–15)
Albumin: 1.5 g/dL — ABNORMAL LOW (ref 3.5–5.0)
BUN: 46 mg/dL — ABNORMAL HIGH (ref 6–20)
CALCIUM: 7.8 mg/dL — AB (ref 8.9–10.3)
CHLORIDE: 101 mmol/L (ref 101–111)
CO2: 25 mmol/L (ref 22–32)
CREATININE: 1.91 mg/dL — AB (ref 0.61–1.24)
GFR, EST AFRICAN AMERICAN: 38 mL/min — AB (ref >60)
GFR, EST NON AFRICAN AMERICAN: 33 mL/min — AB (ref >60)
Glucose, Bld: 262 mg/dL — ABNORMAL HIGH (ref 65–99)
Phosphorus: 3.1 mg/dL (ref 2.5–4.6)
Potassium: 4 mmol/L (ref 3.5–5.1)
Sodium: 134 mmol/L — ABNORMAL LOW (ref 135–145)

## 2015-11-04 LAB — COMPREHENSIVE METABOLIC PANEL
ALT: 53 U/L (ref 17–63)
ANION GAP: 7 (ref 5–15)
AST: 54 U/L — ABNORMAL HIGH (ref 15–41)
Albumin: 1.5 g/dL — ABNORMAL LOW (ref 3.5–5.0)
Alkaline Phosphatase: 269 U/L — ABNORMAL HIGH (ref 38–126)
BUN: 45 mg/dL — ABNORMAL HIGH (ref 6–20)
CHLORIDE: 100 mmol/L — AB (ref 101–111)
CO2: 26 mmol/L (ref 22–32)
Calcium: 7.8 mg/dL — ABNORMAL LOW (ref 8.9–10.3)
Creatinine, Ser: 1.95 mg/dL — ABNORMAL HIGH (ref 0.61–1.24)
GFR calc non Af Amer: 32 mL/min — ABNORMAL LOW (ref >60)
GFR, EST AFRICAN AMERICAN: 37 mL/min — AB (ref >60)
Glucose, Bld: 261 mg/dL — ABNORMAL HIGH (ref 65–99)
POTASSIUM: 4 mmol/L (ref 3.5–5.1)
SODIUM: 133 mmol/L — AB (ref 135–145)
Total Bilirubin: 1.1 mg/dL (ref 0.3–1.2)
Total Protein: 5.9 g/dL — ABNORMAL LOW (ref 6.5–8.1)

## 2015-11-04 NOTE — Progress Notes (Signed)
Subjective:  The patient had dialysis performed today. No significant change in his mental status thus far. Apparently he will be getting PEG tube placement tomorrow.    Objective:  Vital signs in last 24 hours:  Temperature 97.3 pulse 69 respirations 27 blood pressure 125/30   Physical Exam: General: Critically ill-appearing  HEENT [place]/AT eyes open NG in place  Neck Trach in place  Pulm/lungs Ventilator dependent, bilateral rhonchi  CVS/Heart Irregular, A Fib  Abdomen:  Soft, nondistended, BS present  Extremities: + peripheral edema, RLE amputation   Neurologic: Not following commands, eyes open, doesn't track  Skin: No rashes  Access: Right arm AV graft, bruit present       Basic Metabolic Panel:   Recent Labs Lab 10/31/15 0739 11/01/15 0850 11/02/15 0643 11/03/15 0605 11/04/15 0202  NA 134* 138 135 136 133*  134*  K 3.4* 3.2* 3.2* 2.9* 4.0  4.0  CL 100* 100* 100* 101 100*  101  CO2 GLUCOSE 132* 129* 123* 159* 261*  262*  BUN 70* 38* 38* 39* 45*  46*  CREATININE 1.98* 1.59* 1.64* 1.56* 1.95*  1.91*  CALCIUM 7.8* 7.9* 7.7* 7.7* 7.8*  7.8*  PHOS 3.5  --   --   --  3.1     CBC:  Recent Labs Lab 10/31/15 0736 11/01/15 0850 11/02/15 0643 11/03/15 0605 11/04/15 0202  WBC 11.8* 11.9* 10.0 10.5 12.2*  HGB 6.3* 7.1* 8.4* 7.8* 8.2*  HCT 20.1* 22.5* 26.9* 24.5* 26.6*  MCV 84.1 84.6 84.6 85.7 86.9  PLT 169 184 186 213 222      Microbiology:  Recent Results (from the past 720 hour(s))  Urine culture     Status: Abnormal   Collection Time: 10/19/15 10:00 AM  Result Value Ref Range Status   Specimen Description URINE, RANDOM  Final   Special Requests NONE  Final   Culture >=100,000 COLONIES/mL YEAST (A)  Final   Report Status 10/20/2015 FINAL  Final    Coagulation Studies:  Recent Labs  11/03/15 0605  LABPROT 19.1*  INR 1.60*    Urinalysis: No results for input(s): COLORURINE, LABSPEC, PHURINE, GLUCOSEU, HGBUR,  BILIRUBINUR, KETONESUR, PROTEINUR, UROBILINOGEN, NITRITE, LEUKOCYTESUR in the last 72 hours.  Invalid input(s): APPERANCEUR    Imaging: Dg Chest Port 1 View  11/04/2015  CLINICAL DATA:  75 year old male with new onset of fever today. EXAM: PORTABLE CHEST 1 VIEW COMPARISON:  Chest x-ray 10/10/2015. FINDINGS: A tracheostomy tube is in place with tip 7.6 cm above the carina. A nasogastric tube is seen extending into the stomach, however, the tip of the nasogastric tube extends below the lower margin of the image. Extensive bibasilar opacities, increasing compared to the prior examination, which may reflect areas of atelectasis and/or airspace consolidation. Moderate left-sided and small right-sided pleural effusions, similar to the prior examination. No evidence of pulmonary edema. Mild diffuse interstitial prominence appears to be chronic. No evidence of pulmonary edema. Heart size is borderline enlarged. The patient is rotated to the left on today's exam, resulting in distortion of the mediastinal contours and reduced diagnostic sensitivity and specificity for mediastinal pathology. Atherosclerosis in the thoracic aorta. Status post median sternotomy for CABG. IMPRESSION: 1. Postoperative changes and support apparatus, as above. 2. Chronic moderate left and small right pleural effusions with worsening bibasilar areas of atelectasis and/or airspace consolidation. Electronically Signed   By: Trudie Reed M.D.   On: 11/04/2015 14:19     Medications:  Assessment/ Plan:  75 y.o. male with a PMHx of severe ischemic cardiopathy with ejection fraction of 25%, atrial fibrillation, congestive heart failure, chronic kidney disease stage IV, recent acute respiratory failure secondary to aspiration pneumonia, recent PEA arrest with anoxic brain injury, who was admitted to [facility] on 10/05/2015 for ongoing weaning efforts.   1. Acute renal failure. with RUE AVG. Dialysis M-W-F schedule 2.  CKD stage IV. 3. Recent PEA arrest with anoxic brain injury. 4. Acute Respiratory failure. 5. Anemia of CKD. 6. Secondary hyperparathyroidism. 7. Seizure disorder noted 10/04/15.  Plan: Patient completed hemodialysis today. We will plan for dialysis again on Wednesday. As before no significant change in his underlying mental status. He may possibly have PEG tube placement tomorrow. As before his overall prognosis remains quite guarded. He will likely require placement in a facility that can accommodate tracheostomy, PEG tube, and dialysis. We will discuss this further with care management later this week when they return.   LOS:  [place] Selke 5/29/20176:40 PM

## 2015-11-05 LAB — PROTIME-INR
INR: 1.69 — ABNORMAL HIGH (ref 0.00–1.49)
PROTHROMBIN TIME: 19.9 s — AB (ref 11.6–15.2)

## 2015-11-05 LAB — COMPREHENSIVE METABOLIC PANEL
ALBUMIN: 1.4 g/dL — AB (ref 3.5–5.0)
ALK PHOS: 213 U/L — AB (ref 38–126)
ALT: 36 U/L (ref 17–63)
AST: 33 U/L (ref 15–41)
Anion gap: 8 (ref 5–15)
BUN: 33 mg/dL — ABNORMAL HIGH (ref 6–20)
CALCIUM: 7.9 mg/dL — AB (ref 8.9–10.3)
CHLORIDE: 100 mmol/L — AB (ref 101–111)
CO2: 28 mmol/L (ref 22–32)
CREATININE: 1.46 mg/dL — AB (ref 0.61–1.24)
GFR calc Af Amer: 53 mL/min — ABNORMAL LOW (ref >60)
GFR calc non Af Amer: 46 mL/min — ABNORMAL LOW (ref >60)
GLUCOSE: 113 mg/dL — AB (ref 65–99)
Potassium: 3.5 mmol/L (ref 3.5–5.1)
SODIUM: 136 mmol/L (ref 135–145)
Total Bilirubin: 0.9 mg/dL (ref 0.3–1.2)
Total Protein: 5.7 g/dL — ABNORMAL LOW (ref 6.5–8.1)

## 2015-11-05 LAB — CBC
HCT: 25 % — ABNORMAL LOW (ref 39.0–52.0)
HEMOGLOBIN: 7.8 g/dL — AB (ref 13.0–17.0)
MCH: 27.5 pg (ref 26.0–34.0)
MCHC: 31.2 g/dL (ref 30.0–36.0)
MCV: 88 fL (ref 78.0–100.0)
PLATELETS: 226 10*3/uL (ref 150–400)
RBC: 2.84 MIL/uL — ABNORMAL LOW (ref 4.22–5.81)
RDW: 15.8 % — ABNORMAL HIGH (ref 11.5–15.5)
WBC: 14 10*3/uL — ABNORMAL HIGH (ref 4.0–10.5)

## 2015-11-05 LAB — HEPATITIS B SURFACE ANTIGEN: HEP B S AG: NEGATIVE

## 2015-11-05 NOTE — Progress Notes (Signed)
Patient ID: Robert Patrick, male   DOB: 16-Nov-1940, 75 y.o.   MRN: [id number]   G tube had been reviewed and approved Seen and ready - see note 5/25  Wbc going up INR increasing-- off coumadin  Dr Sharyon MedicusHijazi has placed G tube on HOLD for now  Will re order if need

## 2015-11-06 LAB — RENAL FUNCTION PANEL
Albumin: 1.4 g/dL — ABNORMAL LOW (ref 3.5–5.0)
Anion gap: 9 (ref 5–15)
BUN: 40 mg/dL — AB (ref 6–20)
CHLORIDE: 100 mmol/L — AB (ref 101–111)
CO2: 25 mmol/L (ref 22–32)
CREATININE: 1.72 mg/dL — AB (ref 0.61–1.24)
Calcium: 7.9 mg/dL — ABNORMAL LOW (ref 8.9–10.3)
GFR calc Af Amer: 43 mL/min — ABNORMAL LOW (ref >60)
GFR, EST NON AFRICAN AMERICAN: 37 mL/min — AB (ref >60)
Glucose, Bld: 82 mg/dL (ref 65–99)
POTASSIUM: 4 mmol/L (ref 3.5–5.1)
Phosphorus: 3.2 mg/dL (ref 2.5–4.6)
Sodium: 134 mmol/L — ABNORMAL LOW (ref 135–145)

## 2015-11-06 LAB — COMPREHENSIVE METABOLIC PANEL
ALBUMIN: 1.4 g/dL — AB (ref 3.5–5.0)
ALT: 49 U/L (ref 17–63)
AST: 61 U/L — AB (ref 15–41)
Alkaline Phosphatase: 260 U/L — ABNORMAL HIGH (ref 38–126)
Anion gap: 7 (ref 5–15)
BUN: 40 mg/dL — AB (ref 6–20)
CHLORIDE: 101 mmol/L (ref 101–111)
CO2: 27 mmol/L (ref 22–32)
CREATININE: 1.75 mg/dL — AB (ref 0.61–1.24)
Calcium: 7.9 mg/dL — ABNORMAL LOW (ref 8.9–10.3)
GFR calc Af Amer: 42 mL/min — ABNORMAL LOW (ref >60)
GFR calc non Af Amer: 37 mL/min — ABNORMAL LOW (ref >60)
GLUCOSE: 85 mg/dL (ref 65–99)
POTASSIUM: 3.5 mmol/L (ref 3.5–5.1)
SODIUM: 135 mmol/L (ref 135–145)
Total Bilirubin: 0.9 mg/dL (ref 0.3–1.2)
Total Protein: 5.9 g/dL — ABNORMAL LOW (ref 6.5–8.1)

## 2015-11-06 LAB — URINALYSIS, ROUTINE W REFLEX MICROSCOPIC
Glucose, UA: NEGATIVE mg/dL
Ketones, ur: 15 mg/dL — AB
NITRITE: POSITIVE — AB
PH: 5.5 (ref 5.0–8.0)
Protein, ur: 100 mg/dL — AB
SPECIFIC GRAVITY, URINE: 1.027 (ref 1.005–1.030)

## 2015-11-06 LAB — CBC
HEMATOCRIT: 24 % — AB (ref 39.0–52.0)
Hemoglobin: 7.5 g/dL — ABNORMAL LOW (ref 13.0–17.0)
MCH: 27.2 pg (ref 26.0–34.0)
MCHC: 31.3 g/dL (ref 30.0–36.0)
MCV: 87 fL (ref 78.0–100.0)
PLATELETS: 255 10*3/uL (ref 150–400)
RBC: 2.76 MIL/uL — ABNORMAL LOW (ref 4.22–5.81)
RDW: 15.6 % — AB (ref 11.5–15.5)
WBC: 11.4 10*3/uL — AB (ref 4.0–10.5)

## 2015-11-06 LAB — URINE MICROSCOPIC-ADD ON

## 2015-11-06 LAB — VANCOMYCIN, RANDOM: Vancomycin Rm: 5 ug/mL

## 2015-11-06 NOTE — Progress Notes (Signed)
Subjective:  The patient had dialysis performed today. No significant change in his mental status thus far. PEG tube placement postpone Ultrafiltration 1500 cc today  Blood flow rate400  avG   Objective:  Vital signs in last 24 hours:  Temperature 97.9, pulse 88, respirations 22, blood pressure 112/55   Physical Exam: General: Critically ill-appearing  HEENT [place]/AT eyes open NG in place  Neck Trach in place  Pulm/lungs Ventilator dependent, bilateral rhonchi  CVS/Heart Irregular, A Fib  Abdomen:  Soft, nondistended, BS present  Extremities: + peripheral edema, RLE amputation   Neurologic: Not following commands, eyes open, doesn't track  Skin: No rashes  Access: Right arm AV graft, bruit present       Basic Metabolic Panel:   Recent Labs Lab 10/31/15 0739  11/02/15 0643 11/03/15 0605 11/04/15 0202 11/05/15 0634 11/06/15 0852  NA 134*  < > 135 136 133*  134* 136 135  134*  K 3.4*  < > 3.2* 2.9* 4.0  4.0 3.5 3.5  4.0  CL 100*  < > 100* 101 100*  101 100* 101  100*  CO2 27  < > 26 26 26  25 28 27  25   GLUCOSE 132*  < > 123* 159* 261*  262* 113* 85  82  BUN 70*  < > 38* 39* 45*  46* 33* 40*  40*  CREATININE 1.98*  < > 1.64* 1.56* 1.95*  1.91* 1.46* 1.75*  1.72*  CALCIUM 7.8*  < > 7.7* 7.7* 7.8*  7.8* 7.9* 7.9*  7.9*  PHOS 3.5  --   --   --  3.1  --  3.2  < > = values in this interval not displayed.   CBC:  Recent Labs Lab 11/02/15 0643 11/03/15 0605 11/04/15 0202 11/05/15 0634 11/06/15 0852  WBC 10.0 10.5 12.2* 14.0* 11.4*  HGB 8.4* 7.8* 8.2* 7.8* 7.5*  HCT 26.9* 24.5* 26.6* 25.0* 24.0*  MCV 84.6 85.7 86.9 88.0 87.0  PLT 186 213 222 226 255      Microbiology:  Recent Results (from the past 720 hour(s))  Urine culture     Status: Abnormal   Collection Time: 10/19/15 10:00 AM  Result Value Ref Range Status   Specimen Description URINE, RANDOM  Final   Special Requests NONE  Final   Culture >=100,000 COLONIES/mL YEAST (A)  Final   Report Status 10/20/2015 FINAL  Final  Culture, blood (routine x 2)     Status: None (Preliminary result)   Collection Time: 11/04/15  1:00 PM  Result Value Ref Range Status   Specimen Description BLOOD LEFT ANTECUBITAL  Final   Special Requests BOTTLES DRAWN AEROBIC AND ANAEROBIC 10CC  Final   Culture NO GROWTH 2 DAYS  Final   Report Status PENDING  Incomplete  Culture, blood (routine x 2)     Status: None (Preliminary result)   Collection Time: 11/04/15  1:15 PM  Result Value Ref Range Status   Specimen Description BLOOD LEFT HAND  Final   Special Requests BOTTLES DRAWN AEROBIC AND ANAEROBIC 10CC  Final   Culture NO GROWTH 2 DAYS  Final   Report Status PENDING  Incomplete  Culture, blood (routine x 2)     Status: None (Preliminary result)   Collection Time: 11/05/15  3:00 PM  Result Value Ref Range Status   Specimen Description BLOOD RIGHT ANTECUBITAL  Final   Special Requests BOTTLES DRAWN AEROBIC ONLY 7CC  Final   Culture NO GROWTH < 24 HOURS  Final  Report Status PENDING  Incomplete  Culture, blood (routine x 2)     Status: None (Preliminary result)   Collection Time: 11/05/15  3:12 PM  Result Value Ref Range Status   Specimen Description BLOOD RIGHT HAND  Final   Special Requests IN PEDIATRIC BOTTLE 3CC  Final   Culture NO GROWTH < 24 HOURS  Final   Report Status PENDING  Incomplete    Coagulation Studies:  Recent Labs  11/05/15 0652  LABPROT 19.9*  INR 1.69*    Urinalysis:  Recent Labs  11/06/15 0630  COLORURINE ORANGE*  LABSPEC 1.027  PHURINE 5.5  GLUCOSEU NEGATIVE  HGBUR MODERATE*  BILIRUBINUR SMALL*  KETONESUR 15*  PROTEINUR 100*  NITRITE POSITIVE*  LEUKOCYTESUR MODERATE*      Imaging: No results found.   Medications:       Assessment/ Plan:  75 y.o. 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   1. Acute renal failure. with RUE AVG. Dialysis M-W-F schedule 2. CKD stage IV. 3. Recent PEA arrest with anoxic brain injury. 4. Acute Respiratory failure. 5. Anemia of CKD. 6. Secondary hyperparathyroidism. 7. Seizure disorder noted 10/04/15.  Plan: Patient completed hemodialysis today. As before no significant change in his underlying mental status. As before his overall prognosis remains quite guarded. He will likely require placement in a facility that can accommodate tracheostomy, PEG tube, and dialysis.  Urine output remains low at 200 cc perday   LOS:  Rozella Servello 5/31/20174:20 PM

## 2015-11-07 LAB — URINE CULTURE: Culture: >=100000 — AB

## 2015-11-07 LAB — COMPREHENSIVE METABOLIC PANEL
ALBUMIN: 1.4 g/dL — AB (ref 3.5–5.0)
ALT: 51 U/L (ref 17–63)
AST: 60 U/L — AB (ref 15–41)
Alkaline Phosphatase: 275 U/L — ABNORMAL HIGH (ref 38–126)
Anion gap: 7 (ref 5–15)
BUN: 34 mg/dL — AB (ref 6–20)
CHLORIDE: 101 mmol/L (ref 101–111)
CO2: 29 mmol/L (ref 22–32)
CREATININE: 1.78 mg/dL — AB (ref 0.61–1.24)
Calcium: 7.9 mg/dL — ABNORMAL LOW (ref 8.9–10.3)
GFR calc Af Amer: 42 mL/min — ABNORMAL LOW (ref >60)
GFR, EST NON AFRICAN AMERICAN: 36 mL/min — AB (ref >60)
GLUCOSE: 141 mg/dL — AB (ref 65–99)
POTASSIUM: 3.5 mmol/L (ref 3.5–5.1)
SODIUM: 137 mmol/L (ref 135–145)
Total Bilirubin: 0.9 mg/dL (ref 0.3–1.2)
Total Protein: 6 g/dL — ABNORMAL LOW (ref 6.5–8.1)

## 2015-11-07 LAB — CBC
HEMATOCRIT: 24.6 % — AB (ref 39.0–52.0)
Hemoglobin: 7.5 g/dL — ABNORMAL LOW (ref 13.0–17.0)
MCH: 26.9 pg (ref 26.0–34.0)
MCHC: 30.5 g/dL (ref 30.0–36.0)
MCV: 88.2 fL (ref 78.0–100.0)
PLATELETS: 277 10*3/uL (ref 150–400)
RBC: 2.79 MIL/uL — ABNORMAL LOW (ref 4.22–5.81)
RDW: 15.6 % — AB (ref 11.5–15.5)
WBC: 13.7 10*3/uL — ABNORMAL HIGH (ref 4.0–10.5)

## 2015-11-07 LAB — CREATININE, URINE, RANDOM: CREATININE, URINE: 220.43 mg/dL

## 2015-11-07 NOTE — Progress Notes (Signed)
Patient ID: Robert Patrick, male   DOB: Dec 20, 1940, 75 y.o.   MRN: [id number]   Re request for percutaneous Gastric tube in IR Pt has been deemed appropriate technically---anatomy is conducive to procedure Had been scheduled for last week, but developed fever; high wbc and INR trending up  INR 1.69  5/31 Wbc 13.7  5/31----not on steroids B Cx neg T: 101.3  To safely place perc G tube Pt must be afeb x 48 hrs; wbc wnl; INR wnl  Please re order when pt more appropriate for procedure Thanks

## 2015-11-07 DEATH — deceased

## 2015-11-08 LAB — CBC
HEMATOCRIT: 25.3 % — AB (ref 39.0–52.0)
HEMOGLOBIN: 7.9 g/dL — AB (ref 13.0–17.0)
MCH: 27.9 pg (ref 26.0–34.0)
MCHC: 31.2 g/dL (ref 30.0–36.0)
MCV: 89.4 fL (ref 78.0–100.0)
Platelets: 291 10*3/uL (ref 150–400)
RBC: 2.83 MIL/uL — ABNORMAL LOW (ref 4.22–5.81)
RDW: 15.5 % (ref 11.5–15.5)
WBC: 14.6 10*3/uL — ABNORMAL HIGH (ref 4.0–10.5)

## 2015-11-08 LAB — COMPREHENSIVE METABOLIC PANEL
ALK PHOS: 277 U/L — AB (ref 38–126)
ALT: 57 U/L (ref 17–63)
ANION GAP: 8 (ref 5–15)
AST: 66 U/L — ABNORMAL HIGH (ref 15–41)
Albumin: 1.5 g/dL — ABNORMAL LOW (ref 3.5–5.0)
BUN: 45 mg/dL — ABNORMAL HIGH (ref 6–20)
CALCIUM: 8 mg/dL — AB (ref 8.9–10.3)
CHLORIDE: 99 mmol/L — AB (ref 101–111)
CO2: 29 mmol/L (ref 22–32)
CREATININE: 1.89 mg/dL — AB (ref 0.61–1.24)
GFR, EST AFRICAN AMERICAN: 39 mL/min — AB (ref >60)
GFR, EST NON AFRICAN AMERICAN: 33 mL/min — AB (ref >60)
Glucose, Bld: 115 mg/dL — ABNORMAL HIGH (ref 65–99)
Potassium: 3.5 mmol/L (ref 3.5–5.1)
SODIUM: 136 mmol/L (ref 135–145)
Total Bilirubin: 0.8 mg/dL (ref 0.3–1.2)
Total Protein: 6.2 g/dL — ABNORMAL LOW (ref 6.5–8.1)

## 2015-11-08 LAB — RENAL FUNCTION PANEL
ANION GAP: 8 (ref 5–15)
Albumin: 1.4 g/dL — ABNORMAL LOW (ref 3.5–5.0)
BUN: 46 mg/dL — ABNORMAL HIGH (ref 6–20)
CHLORIDE: 98 mmol/L — AB (ref 101–111)
CO2: 29 mmol/L (ref 22–32)
Calcium: 8 mg/dL — ABNORMAL LOW (ref 8.9–10.3)
Creatinine, Ser: 1.89 mg/dL — ABNORMAL HIGH (ref 0.61–1.24)
GFR calc Af Amer: 39 mL/min — ABNORMAL LOW (ref >60)
GFR calc non Af Amer: 33 mL/min — ABNORMAL LOW (ref >60)
GLUCOSE: 117 mg/dL — AB (ref 65–99)
POTASSIUM: 3.5 mmol/L (ref 3.5–5.1)
Phosphorus: 2.9 mg/dL (ref 2.5–4.6)
SODIUM: 135 mmol/L (ref 135–145)

## 2015-11-08 LAB — VANCOMYCIN, TROUGH: Vancomycin Tr: 6 ug/mL — ABNORMAL LOW (ref 10.0–20.0)

## 2015-11-08 NOTE — Progress Notes (Signed)
Subjective:   Dialysis to be done later today No significant change in mental status Urine output 900 cc   Objective:  Vital signs in last 24 hours:  Temperature 99.4, pulse 80, respirations 19, blood pressure 127/69   Physical Exam: General: Critically ill-appearing  HEENT [place]/AT eyes open NG in place  Neck Trach in place  Pulm/lungs Ventilator dependent, bilateral rhonchi  CVS/Heart Irregular, A Fib  Abdomen:  Soft, nondistended, BS present  Extremities: + peripheral edema, RLE amputation   Neurologic: Not following commands, eyes open, doesn't track  Skin: No rashes  Access: Right arm AV graft,   foley    Basic Metabolic Panel:   Recent Labs Lab 11/04/15 0202 11/05/15 0634 11/06/15 0852 11/07/15 0530 11/08/15 0458  NA 133*  134* 136 135  134* 137 136  135  K 4.0  4.0 3.5 3.5  4.0 3.5 3.5  3.5  CL 100*  101 100* 101  100* 101 99*  98*  CO2 26  25 28 27  25 29 29  29   GLUCOSE 261*  262* 113* 85  82 141* 115*  117*  BUN 45*  46* 33* 40*  40* 34* 45*  46*  CREATININE 1.95*  1.91* 1.46* 1.75*  1.72* 1.78* 1.89*  1.89*  CALCIUM 7.8*  7.8* 7.9* 7.9*  7.9* 7.9* 8.0*  8.0*  PHOS 3.1  --  3.2  --  2.9     CBC:  Recent Labs Lab 11/04/15 0202 11/05/15 0634 11/06/15 0852 11/07/15 0530 11/08/15 0458  WBC 12.2* 14.0* 11.4* 13.7* 14.6*  HGB 8.2* 7.8* 7.5* 7.5* 7.9*  HCT 26.6* 25.0* 24.0* 24.6* 25.3*  MCV 86.9 88.0 87.0 88.2 89.4  PLT 222 226 255 277 291      Microbiology:  Recent Results (from the past 720 hour(s))  Urine culture     Status: Abnormal   Collection Time: 10/19/15 10:00 AM  Result Value Ref Range Status   Specimen Description URINE, RANDOM  Final   Special Requests NONE  Final   Culture >=100,000 COLONIES/mL YEAST (A)  Final   Report Status 10/20/2015 FINAL  Final  Culture, blood (routine x 2)     Status: None (Preliminary result)   Collection Time: 11/04/15  1:00 PM  Result Value Ref Range Status   Specimen  Description BLOOD LEFT ANTECUBITAL  Final   Special Requests BOTTLES DRAWN AEROBIC AND ANAEROBIC 10CC  Final   Culture NO GROWTH 3 DAYS  Final   Report Status PENDING  Incomplete  Culture, blood (routine x 2)     Status: None (Preliminary result)   Collection Time: 11/04/15  1:15 PM  Result Value Ref Range Status   Specimen Description BLOOD LEFT HAND  Final   Special Requests BOTTLES DRAWN AEROBIC AND ANAEROBIC 10CC  Final   Culture NO GROWTH 3 DAYS  Final   Report Status PENDING  Incomplete  Culture, blood (routine x 2)     Status: None (Preliminary result)   Collection Time: 11/05/15  3:00 PM  Result Value Ref Range Status   Specimen Description BLOOD RIGHT ANTECUBITAL  Final   Special Requests BOTTLES DRAWN AEROBIC ONLY 7CC  Final   Culture NO GROWTH 2 DAYS  Final   Report Status PENDING  Incomplete  Culture, blood (routine x 2)     Status: None (Preliminary result)   Collection Time: 11/05/15  3:12 PM  Result Value Ref Range Status   Specimen Description BLOOD RIGHT HAND  Final   Special  Requests IN PEDIATRIC BOTTLE 3CC  Final   Culture NO GROWTH 2 DAYS  Final   Report Status PENDING  Incomplete  Urine culture     Status: Abnormal   Collection Time: 11/06/15  6:30 AM  Result Value Ref Range Status   Specimen Description URINE, RANDOM  Final   Special Requests NONE  Final   Culture >=100,000 COLONIES/mL YEAST (A)  Final   Report Status 11/07/2015 FINAL  Final    Coagulation Studies: No results for input(s): LABPROT, INR in the last 72 hours.  Urinalysis:  Recent Labs  11/06/15 0630  COLORURINE ORANGE*  LABSPEC 1.027  PHURINE 5.5  GLUCOSEU NEGATIVE  HGBUR MODERATE*  BILIRUBINUR SMALL*  KETONESUR 15*  PROTEINUR 100*  NITRITE POSITIVE*  LEUKOCYTESUR MODERATE*      Imaging: No results found.   Medications:       Assessment/ Plan:  75 y.o. 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   1. Acute renal failure. with RUE AVG. Dialysis M-W-F schedule 2. CKD stage IV. 3. Recent PEA arrest with anoxic brain injury. 4. Acute Respiratory failure. 5. Anemia of CKD. 6. Secondary hyperparathyroidism. 7. Seizure disorder noted 10/04/15.  Plan: Patient for hemodialysis today. As before no significant change in his underlying mental status.  His overall prognosis remains quite guarded. He will likely require placement in a facility that can accommodate tracheostomy, PEG tube, and dialysis.  Urine output appears to have improved some Consider checking 24-hour urine for creatinine clearance next week Hold  dialysis on Monday   LOS:  Makailah Slavick 6/2/201710:08 AM

## 2015-11-09 LAB — COMPREHENSIVE METABOLIC PANEL
ALBUMIN: 1.3 g/dL — AB (ref 3.5–5.0)
ALT: 85 U/L — ABNORMAL HIGH (ref 17–63)
ANION GAP: 8 (ref 5–15)
AST: 111 U/L — AB (ref 15–41)
Alkaline Phosphatase: 296 U/L — ABNORMAL HIGH (ref 38–126)
BILIRUBIN TOTAL: 0.7 mg/dL (ref 0.3–1.2)
BUN: 27 mg/dL — ABNORMAL HIGH (ref 6–20)
CHLORIDE: 99 mmol/L — AB (ref 101–111)
CO2: 30 mmol/L (ref 22–32)
Calcium: 8.1 mg/dL — ABNORMAL LOW (ref 8.9–10.3)
Creatinine, Ser: 1.59 mg/dL — ABNORMAL HIGH (ref 0.61–1.24)
GFR calc Af Amer: 48 mL/min — ABNORMAL LOW (ref >60)
GFR calc non Af Amer: 41 mL/min — ABNORMAL LOW (ref >60)
GLUCOSE: 40 mg/dL — AB (ref 65–99)
POTASSIUM: 3.4 mmol/L — AB (ref 3.5–5.1)
SODIUM: 137 mmol/L (ref 135–145)
TOTAL PROTEIN: 5.9 g/dL — AB (ref 6.5–8.1)

## 2015-11-09 LAB — CULTURE, BLOOD (ROUTINE X 2)
Culture: NO GROWTH
Culture: NO GROWTH

## 2015-11-09 LAB — CBC
HEMATOCRIT: 24.1 % — AB (ref 39.0–52.0)
HEMOGLOBIN: 7.4 g/dL — AB (ref 13.0–17.0)
MCH: 27 pg (ref 26.0–34.0)
MCHC: 30.7 g/dL (ref 30.0–36.0)
MCV: 88 fL (ref 78.0–100.0)
Platelets: 315 10*3/uL (ref 150–400)
RBC: 2.74 MIL/uL — ABNORMAL LOW (ref 4.22–5.81)
RDW: 15.5 % (ref 11.5–15.5)
WBC: 13.2 10*3/uL — ABNORMAL HIGH (ref 4.0–10.5)

## 2015-11-10 LAB — CULTURE, BLOOD (ROUTINE X 2)
CULTURE: NO GROWTH
CULTURE: NO GROWTH

## 2015-11-10 LAB — COMPREHENSIVE METABOLIC PANEL
ALBUMIN: 1.3 g/dL — AB (ref 3.5–5.0)
ALK PHOS: 308 U/L — AB (ref 38–126)
ALT: 91 U/L — AB (ref 17–63)
AST: 103 U/L — AB (ref 15–41)
Anion gap: 12 (ref 5–15)
BUN: 45 mg/dL — ABNORMAL HIGH (ref 6–20)
CALCIUM: 7.9 mg/dL — AB (ref 8.9–10.3)
CO2: 24 mmol/L (ref 22–32)
CREATININE: 2.36 mg/dL — AB (ref 0.61–1.24)
Chloride: 97 mmol/L — ABNORMAL LOW (ref 101–111)
GFR calc Af Amer: 30 mL/min — ABNORMAL LOW (ref >60)
GFR calc non Af Amer: 26 mL/min — ABNORMAL LOW (ref >60)
GLUCOSE: 212 mg/dL — AB (ref 65–99)
Potassium: 3.9 mmol/L (ref 3.5–5.1)
SODIUM: 133 mmol/L — AB (ref 135–145)
Total Bilirubin: 1.1 mg/dL (ref 0.3–1.2)
Total Protein: 6.1 g/dL — ABNORMAL LOW (ref 6.5–8.1)

## 2015-11-10 LAB — CBC
HCT: 23.9 % — ABNORMAL LOW (ref 39.0–52.0)
HEMOGLOBIN: 7.3 g/dL — AB (ref 13.0–17.0)
MCH: 27 pg (ref 26.0–34.0)
MCHC: 30.5 g/dL (ref 30.0–36.0)
MCV: 88.5 fL (ref 78.0–100.0)
PLATELETS: 302 10*3/uL (ref 150–400)
RBC: 2.7 MIL/uL — ABNORMAL LOW (ref 4.22–5.81)
RDW: 15.6 % — ABNORMAL HIGH (ref 11.5–15.5)
WBC: 15.2 10*3/uL — ABNORMAL HIGH (ref 4.0–10.5)

## 2015-11-11 LAB — CBC
HEMATOCRIT: 20.6 % — AB (ref 39.0–52.0)
Hemoglobin: 6.4 g/dL — CL (ref 13.0–17.0)
MCH: 26.7 pg (ref 26.0–34.0)
MCHC: 30.6 g/dL (ref 30.0–36.0)
MCV: 87.3 fL (ref 78.0–100.0)
Platelets: 343 10*3/uL (ref 150–400)
RBC: 2.36 MIL/uL — AB (ref 4.22–5.81)
RDW: 15.6 % — ABNORMAL HIGH (ref 11.5–15.5)
WBC: 14.6 10*3/uL — AB (ref 4.0–10.5)

## 2015-11-11 LAB — COMPREHENSIVE METABOLIC PANEL
ALT: 75 U/L — ABNORMAL HIGH (ref 17–63)
AST: 67 U/L — AB (ref 15–41)
Albumin: 1.3 g/dL — ABNORMAL LOW (ref 3.5–5.0)
Alkaline Phosphatase: 302 U/L — ABNORMAL HIGH (ref 38–126)
Anion gap: 10 (ref 5–15)
BUN: 62 mg/dL — AB (ref 6–20)
CHLORIDE: 96 mmol/L — AB (ref 101–111)
CO2: 27 mmol/L (ref 22–32)
Calcium: 8 mg/dL — ABNORMAL LOW (ref 8.9–10.3)
Creatinine, Ser: 3.19 mg/dL — ABNORMAL HIGH (ref 0.61–1.24)
GFR, EST AFRICAN AMERICAN: 21 mL/min — AB (ref >60)
GFR, EST NON AFRICAN AMERICAN: 18 mL/min — AB (ref >60)
Glucose, Bld: 211 mg/dL — ABNORMAL HIGH (ref 65–99)
POTASSIUM: 3.3 mmol/L — AB (ref 3.5–5.1)
Sodium: 133 mmol/L — ABNORMAL LOW (ref 135–145)
Total Bilirubin: 0.9 mg/dL (ref 0.3–1.2)
Total Protein: 6.1 g/dL — ABNORMAL LOW (ref 6.5–8.1)

## 2015-11-11 LAB — PREPARE RBC (CROSSMATCH)

## 2015-11-11 NOTE — Progress Notes (Signed)
Subjective:  Patient seen at bedside. Patient's daughter is also at bedtime. It appears that the family is hoping to place the patient in a nursing home near where he lives. They have agreed that they would stop dialysis prior to his discharge here as he would not qualify for outpatient dialysis.    Objective:  Vital signs in last 24 hours:  Temperature 99.7 pulse 89 respirations 22 blood pressure 108/54   Physical Exam: General: Critically ill-appearing  HEENT [place]/AT eyes open NG in place  Neck Trach in place  Pulm/lungs Ventilator dependent, bilateral rhonchi  CVS/Heart Irregular, A Fib  Abdomen:  Soft, nondistended, BS present  Extremities: + peripheral edema, RLE amputation   Neurologic: Not following commands, eyes open, doesn't track  Skin: No rashes  Access: Right arm AV graft   foley    Basic Metabolic Panel:   Recent Labs Lab 11/06/15 0852 11/07/15 0530 11/08/15 0458 11/09/15 0622 11/10/15 0544 11/11/15 0703  NA 135  134* 137 136  135 137 133* 133*  K 3.5  4.0 3.5 3.5  3.5 3.4* 3.9 3.3*  CL 101  100* 101 99*  98* 99* 97* 96*  CO2 GLUCOSE 85  82 141* 115*  117* 40* 212* 211*  BUN 40*  40* 34* 45*  46* 27* 45* 62*  CREATININE 1.75*  1.72* 1.78* 1.89*  1.89* 1.59* 2.36* 3.19*  CALCIUM 7.9*  7.9* 7.9* 8.0*  8.0* 8.1* 7.9* 8.0*  PHOS 3.2  --  2.9  --   --   --      CBC:  Recent Labs Lab 11/07/15 0530 11/08/15 0458 11/09/15 0622 11/10/15 0544 11/11/15 0703  WBC 13.7* 14.6* 13.2* 15.2* 14.6*  HGB 7.5* 7.9* 7.4* 7.3* 6.4*  HCT 24.6* 25.3* 24.1* 23.9* 20.6*  MCV 88.2 89.4 88.0 88.5 87.3  PLT 277 291 315 302 343      Microbiology:  Recent Results (from the past 720 hour(s))  Urine culture     Status: Abnormal   Collection Time: 10/19/15 10:00 AM  Result Value Ref Range Status   Specimen Description URINE, RANDOM  Final   Special Requests NONE  Final   Culture >=100,000 COLONIES/mL YEAST (A)  Final    Report Status 10/20/2015 FINAL  Final  Culture, blood (routine x 2)     Status: None   Collection Time: 11/04/15  1:00 PM  Result Value Ref Range Status   Specimen Description BLOOD LEFT ANTECUBITAL  Final   Special Requests BOTTLES DRAWN AEROBIC AND ANAEROBIC 10CC  Final   Culture NO GROWTH 5 DAYS  Final   Report Status 11/09/2015 FINAL  Final  Culture, blood (routine x 2)     Status: None   Collection Time: 11/04/15  1:15 PM  Result Value Ref Range Status   Specimen Description BLOOD LEFT HAND  Final   Special Requests BOTTLES DRAWN AEROBIC AND ANAEROBIC 10CC  Final   Culture NO GROWTH 5 DAYS  Final   Report Status 11/09/2015 FINAL  Final  Culture, blood (routine x 2)     Status: None   Collection Time: 11/05/15  3:00 PM  Result Value Ref Range Status   Specimen Description BLOOD RIGHT ANTECUBITAL  Final   Special Requests BOTTLES DRAWN AEROBIC ONLY 7CC  Final   Culture NO GROWTH 5 DAYS  Final   Report Status 11/10/2015 FINAL  Final  Culture, blood (routine x 2)  Status: None   Collection Time: 11/05/15  3:12 PM  Result Value Ref Range Status   Specimen Description BLOOD RIGHT HAND  Final   Special Requests IN PEDIATRIC BOTTLE 3CC  Final   Culture NO GROWTH 5 DAYS  Final   Report Status 11/10/2015 FINAL  Final  Urine culture     Status: Abnormal   Collection Time: 11/06/15  6:30 AM  Result Value Ref Range Status   Specimen Description URINE, RANDOM  Final   Special Requests NONE  Final   Culture >=100,000 COLONIES/mL YEAST (A)  Final   Report Status 11/07/2015 FINAL  Final    Coagulation Studies: No results for input(s): LABPROT, INR in the last 72 hours.  Urinalysis: No results for input(s): COLORURINE, LABSPEC, PHURINE, GLUCOSEU, HGBUR, BILIRUBINUR, KETONESUR, PROTEINUR, UROBILINOGEN, NITRITE, LEUKOCYTESUR in the last 72 hours.  Invalid input(s): APPERANCEUR    Imaging: No results found.   Medications:       Assessment/ Plan:  75 y.o. 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   1. Acute renal failure. with RUE AVG. Dialysis TTHS. 2. CKD stage IV. 3. Recent PEA arrest with anoxic brain injury. 4. Acute Respiratory failure. 5. Anemia of CKD. 6. Secondary hyperparathyroidism. 7. Seizure disorder noted 10/04/15.  Plan: BUN and creatinine have both trended up significantly over the weekend. It appears that the patient is approaching or has approached end-stage renal disease. His mental status has not changed significantly during his stay here. It appears that the family has decided against placement in the remote unit that can handle tracheostomy, PEG tube, and dialysis. They have stated that they were willing to place him in a nursing home close to his actual home. However they would like for us to continue to provide renal placement therapy while here. Therefore we will restart the patient on Tuesday, Thursday, Saturday dialysis schedule. Hemoglobin has also dropped to 6.4 and primary team considering blood transfusion.  LOS:  Trigg Delarocha 6/5/20174:37 PM

## 2015-11-12 LAB — TYPE AND SCREEN
ABO/RH(D): A POS
Antibody Screen: NEGATIVE
UNIT DIVISION: 0

## 2015-11-12 LAB — CBC
HCT: 24.3 % — ABNORMAL LOW (ref 39.0–52.0)
Hemoglobin: 7.6 g/dL — ABNORMAL LOW (ref 13.0–17.0)
MCH: 27.1 pg (ref 26.0–34.0)
MCHC: 31.3 g/dL (ref 30.0–36.0)
MCV: 86.8 fL (ref 78.0–100.0)
PLATELETS: 337 10*3/uL (ref 150–400)
RBC: 2.8 MIL/uL — ABNORMAL LOW (ref 4.22–5.81)
RDW: 15.1 % (ref 11.5–15.5)
WBC: 13.7 10*3/uL — ABNORMAL HIGH (ref 4.0–10.5)

## 2015-11-12 LAB — COMPREHENSIVE METABOLIC PANEL
ALBUMIN: 1.3 g/dL — AB (ref 3.5–5.0)
ALT: 75 U/L — ABNORMAL HIGH (ref 17–63)
AST: 78 U/L — ABNORMAL HIGH (ref 15–41)
Alkaline Phosphatase: 311 U/L — ABNORMAL HIGH (ref 38–126)
Anion gap: 11 (ref 5–15)
BUN: 75 mg/dL — ABNORMAL HIGH (ref 6–20)
CALCIUM: 8.1 mg/dL — AB (ref 8.9–10.3)
CHLORIDE: 96 mmol/L — AB (ref 101–111)
CO2: 27 mmol/L (ref 22–32)
Creatinine, Ser: 3.66 mg/dL — ABNORMAL HIGH (ref 0.61–1.24)
GFR calc non Af Amer: 15 mL/min — ABNORMAL LOW (ref >60)
GFR, EST AFRICAN AMERICAN: 17 mL/min — AB (ref >60)
GLUCOSE: 121 mg/dL — AB (ref 65–99)
POTASSIUM: 3.5 mmol/L (ref 3.5–5.1)
SODIUM: 134 mmol/L — AB (ref 135–145)
Total Bilirubin: 1.3 mg/dL — ABNORMAL HIGH (ref 0.3–1.2)
Total Protein: 6.2 g/dL — ABNORMAL LOW (ref 6.5–8.1)

## 2015-11-12 LAB — PROTIME-INR
INR: 1.66 — AB (ref 0.00–1.49)
PROTHROMBIN TIME: 19.6 s — AB (ref 11.6–15.2)

## 2015-11-12 LAB — APTT: aPTT: 40 seconds — ABNORMAL HIGH (ref 24–37)

## 2015-11-12 LAB — VANCOMYCIN, TROUGH: VANCOMYCIN TR: 26 ug/mL — AB (ref 10.0–20.0)

## 2015-11-12 LAB — PHOSPHORUS: PHOSPHORUS: 3.1 mg/dL (ref 2.5–4.6)

## 2015-11-13 LAB — COMPREHENSIVE METABOLIC PANEL
ALBUMIN: 1.5 g/dL — AB (ref 3.5–5.0)
ALT: 85 U/L — AB (ref 17–63)
ANION GAP: 13 (ref 5–15)
AST: 88 U/L — ABNORMAL HIGH (ref 15–41)
Alkaline Phosphatase: 325 U/L — ABNORMAL HIGH (ref 38–126)
BUN: 85 mg/dL — ABNORMAL HIGH (ref 6–20)
CHLORIDE: 95 mmol/L — AB (ref 101–111)
CO2: 26 mmol/L (ref 22–32)
CREATININE: 4.04 mg/dL — AB (ref 0.61–1.24)
Calcium: 8.2 mg/dL — ABNORMAL LOW (ref 8.9–10.3)
GFR calc non Af Amer: 13 mL/min — ABNORMAL LOW (ref >60)
GFR, EST AFRICAN AMERICAN: 15 mL/min — AB (ref >60)
GLUCOSE: 153 mg/dL — AB (ref 65–99)
Potassium: 3.4 mmol/L — ABNORMAL LOW (ref 3.5–5.1)
SODIUM: 134 mmol/L — AB (ref 135–145)
Total Bilirubin: 1.2 mg/dL (ref 0.3–1.2)
Total Protein: 6.4 g/dL — ABNORMAL LOW (ref 6.5–8.1)

## 2015-11-13 LAB — PROTIME-INR
INR: 1.7 — ABNORMAL HIGH (ref 0.00–1.49)
Prothrombin Time: 20 seconds — ABNORMAL HIGH (ref 11.6–15.2)

## 2015-11-13 LAB — CBC
HCT: 22.6 % — ABNORMAL LOW (ref 39.0–52.0)
HEMOGLOBIN: 7.1 g/dL — AB (ref 13.0–17.0)
MCH: 27.3 pg (ref 26.0–34.0)
MCHC: 31.4 g/dL (ref 30.0–36.0)
MCV: 86.9 fL (ref 78.0–100.0)
Platelets: 332 10*3/uL (ref 150–400)
RBC: 2.6 MIL/uL — AB (ref 4.22–5.81)
RDW: 15.3 % (ref 11.5–15.5)
WBC: 12.6 10*3/uL — ABNORMAL HIGH (ref 4.0–10.5)

## 2015-11-13 NOTE — Progress Notes (Signed)
Subjective:  Patient seen and evaluated during hemodialysis today. As before no significant change in neurologic status. Family is wanting to transition him to a nursing home in [place]. They have agreed to discontinue dialysis prior to discharge from here.    Objective:  Vital signs in last 24 hours:  Temperature 98.6 pulse 89 respirations 22 blood pressure 111/58   Physical Exam: General: Critically ill-appearing  HEENT [place]/AT eyes open NG in place  Neck Trach in place  Pulm/lungs Ventilator dependent, bilateral rhonchi  CVS/Heart Irregular, A Fib  Abdomen:  Soft, nondistended, BS present  Extremities: + peripheral edema, RLE amputation   Neurologic: Not following commands, eyes open, doesn't track  Skin: No rashes  Access: Right arm AV graft   foley    Basic Metabolic Panel:   Recent Labs Lab 11/08/15 0458 11/09/15 0622 11/10/15 0544 11/11/15 0703 11/12/15 0753 11/13/15 0801  NA 136  135 137 133* 133* 134* 134*  K 3.5  3.5 3.4* 3.9 3.3* 3.5 3.4*  CL 99*  98* 99* 97* 96* 96* 95*  CO2 GLUCOSE 115*  117* 40* 212* 211* 121* 153*  BUN 45*  46* 27* 45* 62* 75* 85*  CREATININE 1.89*  1.89* 1.59* 2.36* 3.19* 3.66* 4.04*  CALCIUM 8.0*  8.0* 8.1* 7.9* 8.0* 8.1* 8.2*  PHOS 2.9  --   --   --  3.1  --      CBC:  Recent Labs Lab 11/09/15 0622 11/10/15 0544 11/11/15 0703 11/12/15 0753 11/13/15 0801  WBC 13.2* 15.2* 14.6* 13.7* 12.6*  HGB 7.4* 7.3* 6.4* 7.6* 7.1*  HCT 24.1* 23.9* 20.6* 24.3* 22.6*  MCV 88.0 88.5 87.3 86.8 86.9  PLT 315 302 343 337 332      Microbiology:  Recent Results (from the past 720 hour(s))  Urine culture     Status: Abnormal   Collection Time: 10/19/15 10:00 AM  Result Value Ref Range Status   Specimen Description URINE, RANDOM  Final   Special Requests NONE  Final   Culture >=100,000 COLONIES/mL YEAST (A)  Final   Report Status 10/20/2015 FINAL  Final  Culture, blood (routine x 2)     Status:  None   Collection Time: 11/04/15  1:00 PM  Result Value Ref Range Status   Specimen Description BLOOD LEFT ANTECUBITAL  Final   Special Requests BOTTLES DRAWN AEROBIC AND ANAEROBIC 10CC  Final   Culture NO GROWTH 5 DAYS  Final   Report Status 11/09/2015 FINAL  Final  Culture, blood (routine x 2)     Status: None   Collection Time: 11/04/15  1:15 PM  Result Value Ref Range Status   Specimen Description BLOOD LEFT HAND  Final   Special Requests BOTTLES DRAWN AEROBIC AND ANAEROBIC 10CC  Final   Culture NO GROWTH 5 DAYS  Final   Report Status 11/09/2015 FINAL  Final  Culture, blood (routine x 2)     Status: None   Collection Time: 11/05/15  3:00 PM  Result Value Ref Range Status   Specimen Description BLOOD RIGHT ANTECUBITAL  Final   Special Requests BOTTLES DRAWN AEROBIC ONLY 7CC  Final   Culture NO GROWTH 5 DAYS  Final   Report Status 11/10/2015 FINAL  Final  Culture, blood (routine x 2)     Status: None   Collection Time: 11/05/15  3:12 PM  Result Value Ref Range Status   Specimen Description BLOOD RIGHT HAND  Final   Special  Requests IN PEDIATRIC BOTTLE 3CC  Final   Culture NO GROWTH 5 DAYS  Final   Report Status 11/10/2015 FINAL  Final  Urine culture     Status: Abnormal   Collection Time: 11/06/15  6:30 AM  Result Value Ref Range Status   Specimen Description URINE, RANDOM  Final   Special Requests NONE  Final   Culture >=100,000 COLONIES/mL YEAST (A)  Final   Report Status 11/07/2015 FINAL  Final    Coagulation Studies:  Recent Labs  11/12/15 0753 11/13/15 0801  LABPROT 19.6* 20.0*  INR 1.66* 1.70*    Urinalysis: No results for input(s): COLORURINE, LABSPEC, PHURINE, GLUCOSEU, HGBUR, BILIRUBINUR, KETONESUR, PROTEINUR, UROBILINOGEN, NITRITE, LEUKOCYTESUR in the last 72 hours.  Invalid input(s): APPERANCEUR    Imaging: No results found.   Medications:       Assessment/ Plan:  75 y.o. male with a PMHx of severe ischemic cardiopathy with ejection  fraction of 25%, atrial fibrillation, congestive heart failure, chronic kidney disease stage IV, recent acute respiratory failure secondary to aspiration pneumonia, recent PEA arrest with anoxic brain injury, who was admitted to [facility] on 10/04/2015 for ongoing weaning efforts.   1. Acute renal failure. with RUE AVG. Dialysis TTHS. 2. CKD stage IV. 3. Recent PEA arrest with anoxic brain injury. 4. Acute Respiratory failure. 5. Anemia of CKD. 6. Secondary hyperparathyroidism. 7. Seizure disorder noted 10/04/15.  Plan: Patient seen and evaluated during hemodialysis today. Thus far appears to be tolerating well. As before the family has agreed to discontinue dialysis prior to discharge to a nursing home in [place].  Patient had dialysis today as he did not receive doses yesterday. However we will resume the patient on a Tuesday, Thursday, Saturday dialysis schedule. Hemoglobin has improved to 7.1 from 6.4 on 11/11/2015.  Overall prognosis remains dismal.   LOS:  Ledell Codrington 6/7/201711:55 AM

## 2015-11-14 LAB — CBC
HEMATOCRIT: 23 % — AB (ref 39.0–52.0)
HEMOGLOBIN: 7.2 g/dL — AB (ref 13.0–17.0)
MCH: 26.9 pg (ref 26.0–34.0)
MCHC: 31.3 g/dL (ref 30.0–36.0)
MCV: 85.8 fL (ref 78.0–100.0)
Platelets: 350 10*3/uL (ref 150–400)
RBC: 2.68 MIL/uL — ABNORMAL LOW (ref 4.22–5.81)
RDW: 15.3 % (ref 11.5–15.5)
WBC: 11.9 10*3/uL — AB (ref 4.0–10.5)

## 2015-11-14 LAB — PROTIME-INR
INR: 1.46 (ref 0.00–1.49)
Prothrombin Time: 17.8 seconds — ABNORMAL HIGH (ref 11.6–15.2)

## 2015-11-14 LAB — RENAL FUNCTION PANEL
ALBUMIN: 1.5 g/dL — AB (ref 3.5–5.0)
ANION GAP: 10 (ref 5–15)
BUN: 59 mg/dL — AB (ref 6–20)
CHLORIDE: 97 mmol/L — AB (ref 101–111)
CO2: 27 mmol/L (ref 22–32)
Calcium: 8.2 mg/dL — ABNORMAL LOW (ref 8.9–10.3)
Creatinine, Ser: 3.29 mg/dL — ABNORMAL HIGH (ref 0.61–1.24)
GFR, EST AFRICAN AMERICAN: 20 mL/min — AB (ref >60)
GFR, EST NON AFRICAN AMERICAN: 17 mL/min — AB (ref >60)
Glucose, Bld: 128 mg/dL — ABNORMAL HIGH (ref 65–99)
PHOSPHORUS: 3.2 mg/dL (ref 2.5–4.6)
POTASSIUM: 3.2 mmol/L — AB (ref 3.5–5.1)
Sodium: 134 mmol/L — ABNORMAL LOW (ref 135–145)

## 2015-11-14 LAB — PREPARE FRESH FROZEN PLASMA
UNIT DIVISION: 0
Unit division: 0

## 2015-11-15 ENCOUNTER — Other Ambulatory Visit (HOSPITAL_COMMUNITY): Payer: Self-pay

## 2015-11-15 LAB — COMPREHENSIVE METABOLIC PANEL
ALBUMIN: 1.5 g/dL — AB (ref 3.5–5.0)
ALK PHOS: 301 U/L — AB (ref 38–126)
ALT: 60 U/L (ref 17–63)
ANION GAP: 12 (ref 5–15)
AST: 48 U/L — ABNORMAL HIGH (ref 15–41)
BILIRUBIN TOTAL: 1.5 mg/dL — AB (ref 0.3–1.2)
BUN: 29 mg/dL — ABNORMAL HIGH (ref 6–20)
CALCIUM: 8 mg/dL — AB (ref 8.9–10.3)
CO2: 27 mmol/L (ref 22–32)
Chloride: 95 mmol/L — ABNORMAL LOW (ref 101–111)
Creatinine, Ser: 2.52 mg/dL — ABNORMAL HIGH (ref 0.61–1.24)
GFR, EST AFRICAN AMERICAN: 27 mL/min — AB (ref >60)
GFR, EST NON AFRICAN AMERICAN: 24 mL/min — AB (ref >60)
Glucose, Bld: 150 mg/dL — ABNORMAL HIGH (ref 65–99)
POTASSIUM: 3.4 mmol/L — AB (ref 3.5–5.1)
Sodium: 134 mmol/L — ABNORMAL LOW (ref 135–145)
TOTAL PROTEIN: 6.5 g/dL (ref 6.5–8.1)

## 2015-11-15 LAB — PREPARE FRESH FROZEN PLASMA
UNIT DIVISION: 0
Unit division: 0

## 2015-11-15 LAB — CBC
HEMATOCRIT: 25 % — AB (ref 39.0–52.0)
HEMOGLOBIN: 7.7 g/dL — AB (ref 13.0–17.0)
MCH: 26.9 pg (ref 26.0–34.0)
MCHC: 30.8 g/dL (ref 30.0–36.0)
MCV: 87.4 fL (ref 78.0–100.0)
Platelets: 347 10*3/uL (ref 150–400)
RBC: 2.86 MIL/uL — AB (ref 4.22–5.81)
RDW: 15.2 % (ref 11.5–15.5)
WBC: 12.9 10*3/uL — AB (ref 4.0–10.5)

## 2015-11-15 LAB — PROTIME-INR
INR: 1.5 — AB (ref 0.00–1.49)
PROTHROMBIN TIME: 18.2 s — AB (ref 11.6–15.2)

## 2015-11-15 MED ORDER — CEFAZOLIN SODIUM-DEXTROSE 2-4 GM/100ML-% IV SOLN
INTRAVENOUS | Status: AC
  Filled 2015-11-15: qty 100

## 2015-11-15 MED ORDER — LIDOCAINE HCL 1 % IJ SOLN
INTRAMUSCULAR | Status: AC
  Administered 2015-11-15: 10 mL
  Filled 2015-11-15: qty 20

## 2015-11-15 MED ORDER — GLUCAGON HCL RDNA (DIAGNOSTIC) 1 MG IJ SOLR
INTRAMUSCULAR | Status: AC
  Filled 2015-11-15: qty 1

## 2015-11-15 MED ORDER — MIDAZOLAM HCL 2 MG/2ML IJ SOLN
INTRAMUSCULAR | Status: AC
  Filled 2015-11-15: qty 2

## 2015-11-15 MED ORDER — IOPAMIDOL (ISOVUE-300) INJECTION 61%
INTRAVENOUS | Status: AC
  Administered 2015-11-15: 20 mL
  Filled 2015-11-15: qty 50

## 2015-11-15 MED ORDER — FENTANYL CITRATE (PF) 100 MCG/2ML IJ SOLN
INTRAMUSCULAR | Status: AC
  Filled 2015-11-15: qty 2

## 2015-11-15 NOTE — Procedures (Signed)
Successful 20 fr pull through G tube No comp Stable Full use tomorrow Full report in PACS

## 2015-11-15 NOTE — Progress Notes (Signed)
Subjective:  Patient just had PEG tube placed. He also had hematemesis yesterday. He is due for dialysis again tomorrow.    Objective:  Vital signs in last 24 hours:  Temperature 99.4 pulse 91 respirations 22 blood pressure 110/64   Physical Exam: General: Critically ill-appearing  HEENT [place]/AT eyes open NG in place  Neck Trach in place  Pulm/lungs Ventilator dependent, bilateral rhonchi  CVS/Heart Irregular, A Fib  Abdomen:  Soft, nondistended, BS present, PEG in place  Extremities: + peripheral edema, RLE amputation   Neurologic: Not following commands, eyes open, doesn't track  Skin: No rashes  Access: Right arm AV graft   foley    Basic Metabolic Panel:   Recent Labs Lab 11/11/15 0703 11/12/15 0753 11/13/15 0801 11/14/15 0910 11/15/15 0657  NA 133* 134* 134* 134* 134*  K 3.3* 3.5 3.4* 3.2* 3.4*  CL 96* 96* 95* 97* 95*  CO2 27 27 26 27 27   GLUCOSE 211* 121* 153* 128* 150*  BUN 62* 75* 85* 59* 29*  CREATININE 3.19* 3.66* 4.04* 3.29* 2.52*  CALCIUM 8.0* 8.1* 8.2* 8.2* 8.0*  PHOS  --  3.1  --  3.2  --      CBC:  Recent Labs Lab 11/11/15 0703 11/12/15 0753 11/13/15 0801 11/14/15 0910 11/15/15 0657  WBC 14.6* 13.7* 12.6* 11.9* 12.9*  HGB 6.4* 7.6* 7.1* 7.2* 7.7*  HCT 20.6* 24.3* 22.6* 23.0* 25.0*  MCV 87.3 86.8 86.9 85.8 87.4  PLT 343 337 332 350 347      Microbiology:  Recent Results (from the past 720 hour(s))  Urine culture     Status: Abnormal   Collection Time: 10/19/15 10:00 AM  Result Value Ref Range Status   Specimen Description URINE, RANDOM  Final   Special Requests NONE  Final   Culture >=100,000 COLONIES/mL YEAST (A)  Final   Report Status 10/20/2015 FINAL  Final  Culture, blood (routine x 2)     Status: None   Collection Time: 11/04/15  1:00 PM  Result Value Ref Range Status   Specimen Description BLOOD LEFT ANTECUBITAL  Final   Special Requests BOTTLES DRAWN AEROBIC AND ANAEROBIC 10CC  Final   Culture NO GROWTH 5 DAYS   Final   Report Status 11/09/2015 FINAL  Final  Culture, blood (routine x 2)     Status: None   Collection Time: 11/04/15  1:15 PM  Result Value Ref Range Status   Specimen Description BLOOD LEFT HAND  Final   Special Requests BOTTLES DRAWN AEROBIC AND ANAEROBIC 10CC  Final   Culture NO GROWTH 5 DAYS  Final   Report Status 11/09/2015 FINAL  Final  Culture, blood (routine x 2)     Status: None   Collection Time: 11/05/15  3:00 PM  Result Value Ref Range Status   Specimen Description BLOOD RIGHT ANTECUBITAL  Final   Special Requests BOTTLES DRAWN AEROBIC ONLY 7CC  Final   Culture NO GROWTH 5 DAYS  Final   Report Status 11/10/2015 FINAL  Final  Culture, blood (routine x 2)     Status: None   Collection Time: 11/05/15  3:12 PM  Result Value Ref Range Status   Specimen Description BLOOD RIGHT HAND  Final   Special Requests IN PEDIATRIC BOTTLE 3CC  Final   Culture NO GROWTH 5 DAYS  Final   Report Status 11/10/2015 FINAL  Final  Urine culture     Status: Abnormal   Collection Time: 11/06/15  6:30 AM  Result Value Ref Range  Status   Specimen Description URINE, RANDOM  Final   Special Requests NONE  Final   Culture >=100,000 COLONIES/mL YEAST (A)  Final   Report Status 11/07/2015 FINAL  Final    Coagulation Studies:  Recent Labs  11/13/15 0801 11/14/15 0823 11/15/15 0813  LABPROT 20.0* 17.8* 18.2*  INR 1.70* 1.46 1.50*    Urinalysis: No results for input(s): COLORURINE, LABSPEC, PHURINE, GLUCOSEU, HGBUR, BILIRUBINUR, KETONESUR, PROTEINUR, UROBILINOGEN, NITRITE, LEUKOCYTESUR in the last 72 hours.  Invalid input(s): APPERANCEUR    Imaging: Ir Gastrostomy Tube Mod Sed  11/15/2015  INDICATION: CHRONIC VENTILATORY SUPPORT, DIABETES, HYPOXIA RESPIRATORY FAILURE, ENCEPHALOPATHY, DYSPHAGIA, ASPIRATION EXAM: 20 FRENCH PULL-THROUGH GASTROSTOMY Date:  6/9/20176/02/2016 3:09 pm Radiologist:  M. Ruel Favorsrevor Shick, MD Guidance:  Fluoroscopic MEDICATIONS: 1 g vancomycin; Antibiotics were  administered within 1 hour of the procedure. Glucagon 0.5 mg IV ANESTHESIA/SEDATION: Moderate Sedation Time:  None. The patient was continuously monitored during the procedure by the interventional radiology nurse under my direct supervision. CONTRAST:  20 cc Isovue 300  - administered into the gastric lumen. FLUOROSCOPY TIME:  Fluoroscopy Time: 8 minutes 48 seconds (75 mGy). COMPLICATIONS: None immediate. PROCEDURE: Informed consent was obtained from the patient following explanation of the procedure, risks, benefits and alternatives. The patient understands, agrees and consents for the procedure. All questions were addressed. A time out was performed. Maximal barrier sterile technique utilized including caps, mask, sterile gowns, sterile gloves, large sterile drape, hand hygiene, and betadine prep. The left upper quadrant was sterilely prepped and draped. An oral gastric catheter was inserted into the stomach under fluoroscopy. The existing nasogastric feeding tube was removed. Air was injected into the stomach for insufflation and visualization under fluoroscopy. The air distended stomach was confirmed beneath the anterior abdominal wall in the frontal and lateral projections. Under sterile conditions and local anesthesia, a 17 gauge trocar needle was utilized to access the stomach percutaneously beneath the left subcostal margin. Needle position was confirmed within the stomach under biplane fluoroscopy. Contrast injection confirmed position also. A single T tack was deployed for gastropexy. Over an Amplatz guide wire, a 9-French sheath was inserted into the stomach. A snare device was utilized to capture the oral gastric catheter. The snare device was pulled retrograde from the stomach up the esophagus and out the oropharynx. The 20-French pull-through gastrostomy was connected to the snare device and pulled antegrade through the oropharynx down the esophagus into the stomach and then through the percutaneous  tract external to the patient. The gastrostomy was assembled externally. Contrast injection confirms position in the stomach. Images were obtained for documentation. The patient tolerated procedure well. No immediate complication. IMPRESSION: Fluoroscopic insertion of a 20-French "pull-through" gastrostomy. Electronically Signed   By: Judie PetitM.  Shick M.D.   On: 11/15/2015 15:12     Medications:       Assessment/ Plan:  75 y.o. 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   1. Acute renal failure. with RUE AVG. Dialysis TTHS. 2. CKD stage IV. 3. Recent PEA arrest with anoxic brain injury. 4. Acute Respiratory failure. 5. Anemia of CKD. 6. Secondary hyperparathyroidism. 7. Seizure disorder noted 10/04/15.  Plan: Patient seen at bedside post PEG tube placement. Patient's daughter at bedside today. As before we have agreed to continue  hemodialysis right up until the point that he is discharged here. Care management is searching for a nursing home that can accommodate a tracheostomy and ventilator care. The alternative may be to find a hospice center near [place] upon discharge. We will plan for dialysis again tomorrow and prepare orders.   LOS:  Sherece Gambrill 6/9/20175:09 PM

## 2015-11-15 NOTE — Sedation Documentation (Signed)
No sedation given.

## 2015-11-15 NOTE — Sedation Documentation (Signed)
Pt resting, not responsive. No sedation given at this time.

## 2015-11-16 LAB — COMPREHENSIVE METABOLIC PANEL
ALK PHOS: 320 U/L — AB (ref 38–126)
ALT: 62 U/L (ref 17–63)
ANION GAP: 10 (ref 5–15)
AST: 58 U/L — ABNORMAL HIGH (ref 15–41)
Albumin: 1.4 g/dL — ABNORMAL LOW (ref 3.5–5.0)
BILIRUBIN TOTAL: 1.8 mg/dL — AB (ref 0.3–1.2)
BUN: 38 mg/dL — ABNORMAL HIGH (ref 6–20)
CO2: 26 mmol/L (ref 22–32)
Calcium: 8 mg/dL — ABNORMAL LOW (ref 8.9–10.3)
Chloride: 96 mmol/L — ABNORMAL LOW (ref 101–111)
Creatinine, Ser: 3.41 mg/dL — ABNORMAL HIGH (ref 0.61–1.24)
GFR, EST AFRICAN AMERICAN: 19 mL/min — AB (ref >60)
GFR, EST NON AFRICAN AMERICAN: 16 mL/min — AB (ref >60)
Glucose, Bld: 183 mg/dL — ABNORMAL HIGH (ref 65–99)
Potassium: 3.7 mmol/L (ref 3.5–5.1)
SODIUM: 132 mmol/L — AB (ref 135–145)
TOTAL PROTEIN: 6.2 g/dL — AB (ref 6.5–8.1)

## 2015-11-16 LAB — RENAL FUNCTION PANEL
ANION GAP: 9 (ref 5–15)
Albumin: 1.6 g/dL — ABNORMAL LOW (ref 3.5–5.0)
BUN: 22 mg/dL — ABNORMAL HIGH (ref 6–20)
CHLORIDE: 98 mmol/L — AB (ref 101–111)
CO2: 28 mmol/L (ref 22–32)
Calcium: 7.9 mg/dL — ABNORMAL LOW (ref 8.9–10.3)
Creatinine, Ser: 2.35 mg/dL — ABNORMAL HIGH (ref 0.61–1.24)
GFR, EST AFRICAN AMERICAN: 30 mL/min — AB (ref >60)
GFR, EST NON AFRICAN AMERICAN: 26 mL/min — AB (ref >60)
Glucose, Bld: 250 mg/dL — ABNORMAL HIGH (ref 65–99)
POTASSIUM: 5 mmol/L (ref 3.5–5.1)
Phosphorus: 2.6 mg/dL (ref 2.5–4.6)
Sodium: 135 mmol/L (ref 135–145)

## 2015-11-16 LAB — CBC
HEMATOCRIT: 23.1 % — AB (ref 39.0–52.0)
HEMATOCRIT: 25.5 % — AB (ref 39.0–52.0)
HEMOGLOBIN: 7 g/dL — AB (ref 13.0–17.0)
HEMOGLOBIN: 7.7 g/dL — AB (ref 13.0–17.0)
MCH: 26.5 pg (ref 26.0–34.0)
MCH: 26.8 pg (ref 26.0–34.0)
MCHC: 30.3 g/dL (ref 30.0–36.0)
MCHC: 30.2 g/dL (ref 30.0–36.0)
MCV: 87.5 fL (ref 78.0–100.0)
MCV: 88.9 fL (ref 78.0–100.0)
PLATELETS: 366 10*3/uL (ref 150–400)
Platelets: 366 10*3/uL (ref 150–400)
RBC: 2.64 MIL/uL — ABNORMAL LOW (ref 4.22–5.81)
RBC: 2.87 MIL/uL — AB (ref 4.22–5.81)
RDW: 15.4 % (ref 11.5–15.5)
RDW: 15.3 % (ref 11.5–15.5)
WBC: 13.6 10*3/uL — AB (ref 4.0–10.5)
WBC: 17 10*3/uL — AB (ref 4.0–10.5)

## 2015-11-16 LAB — MAGNESIUM: Magnesium: 2.2 mg/dL (ref 1.7–2.4)

## 2015-11-16 NOTE — Progress Notes (Signed)
Referring Physician(s): Carron CurieAli Hijazi  Supervising Physician: Malachy MoanMcCullough, Heath  Patient Status: In-pt  Chief Complaint:  Ventilator dependent respiratory failure  Subjective:  Pt s/p perc gastrostomy tube yesterday.    Allergies: Review of patient's allergies indicates no known allergies.  Medications: Prior to Admission medications   Not on File     Vital Signs: BP 116/68 mmHg  Pulse 91  Resp 21  SpO2 100%  Physical Exam  On vent Abdomen soft Gtube site looks good.  Imaging: Ir Gastrostomy Tube Mod Sed  11/15/2015  INDICATION: CHRONIC VENTILATORY SUPPORT, DIABETES, HYPOXIA RESPIRATORY FAILURE, ENCEPHALOPATHY, DYSPHAGIA, ASPIRATION EXAM: 20 FRENCH PULL-THROUGH GASTROSTOMY Date:  6/9/20176/02/2016 3:09 pm Radiologist:  M. Ruel Favorsrevor Shick, MD Guidance:  Fluoroscopic MEDICATIONS: 1 g vancomycin; Antibiotics were administered within 1 hour of the procedure. Glucagon 0.5 mg IV ANESTHESIA/SEDATION: Moderate Sedation Time:  None. The patient was continuously monitored during the procedure by the interventional radiology nurse under my direct supervision. CONTRAST:  20 cc Isovue 300  - administered into the gastric lumen. FLUOROSCOPY TIME:  Fluoroscopy Time: 8 minutes 48 seconds (75 mGy). COMPLICATIONS: None immediate. PROCEDURE: Informed consent was obtained from the patient following explanation of the procedure, risks, benefits and alternatives. The patient understands, agrees and consents for the procedure. All questions were addressed. A time out was performed. Maximal barrier sterile technique utilized including caps, mask, sterile gowns, sterile gloves, large sterile drape, hand hygiene, and betadine prep. The left upper quadrant was sterilely prepped and draped. An oral gastric catheter was inserted into the stomach under fluoroscopy. The existing nasogastric feeding tube was removed. Air was injected into the stomach for insufflation and visualization under fluoroscopy. The air  distended stomach was confirmed beneath the anterior abdominal wall in the frontal and lateral projections. Under sterile conditions and local anesthesia, a 17 gauge trocar needle was utilized to access the stomach percutaneously beneath the left subcostal margin. Needle position was confirmed within the stomach under biplane fluoroscopy. Contrast injection confirmed position also. A single T tack was deployed for gastropexy. Over an Amplatz guide wire, a 9-French sheath was inserted into the stomach. A snare device was utilized to capture the oral gastric catheter. The snare device was pulled retrograde from the stomach up the esophagus and out the oropharynx. The 20-French pull-through gastrostomy was connected to the snare device and pulled antegrade through the oropharynx down the esophagus into the stomach and then through the percutaneous tract external to the patient. The gastrostomy was assembled externally. Contrast injection confirms position in the stomach. Images were obtained for documentation. The patient tolerated procedure well. No immediate complication. IMPRESSION: Fluoroscopic insertion of a 20-French "pull-through" gastrostomy. Electronically Signed   By: Judie PetitM.  Shick M.D.   On: 11/15/2015 15:12    Labs:  CBC:  Recent Labs  11/13/15 0801 11/14/15 0910 11/15/15 0657 11/16/15 0535  WBC 12.6* 11.9* 12.9* 13.6*  HGB 7.1* 7.2* 7.7* 7.0*  HCT 22.6* 23.0* 25.0* 23.1*  PLT 332 350 347 366    COAGS:  Recent Labs  01-03-2016 2307  11/12/15 0753 11/13/15 0801 11/14/15 0823 11/15/15 0813  INR 1.72*  < > 1.66* 1.70* 1.46 1.50*  APTT 34  --  40*  --   --   --   < > = values in this interval not displayed.  BMP:  Recent Labs  11/13/15 0801 11/14/15 0910 11/15/15 0657 11/16/15 0535  NA 134* 134* 134* 132*  K 3.4* 3.2* 3.4* 3.7  CL 95* 97* 95* 96*  CO2  GLUCOSE 153* 128* 150* 183*  BUN 85* 59* 29* 38*  CALCIUM 8.2* 8.2* 8.0* 8.0*  CREATININE 4.04* 3.29* 2.52*  3.41*  GFRNONAA 13* 17* 24* 16*  GFRAA 15* 20* 27* 19*    LIVER FUNCTION TESTS:  Recent Labs  11/12/15 0753 11/13/15 0801 11/14/15 0910 11/15/15 0657 11/16/15 0535  BILITOT 1.3* 1.2  --  1.5* 1.8*  AST 78* 88*  --  48* 58*  ALT 75* 85*  --  60 62  ALKPHOS 311* 325*  --  301* 320*  PROT 6.2* 6.4*  --  6.5 6.2*  ALBUMIN 1.3* 1.5* 1.5* 1.5* 1.4*    Assessment and Plan:  VDRF  S/P Perc Gastrostomy tube placement by Dr. Miles Costain 11/15/2015 Ok to start tube feeds.  Electronically Signed: Gwynneth Macleod PA-C 11/16/2015, 12:03 PM   I spent a total of 15 Minutes at the the patient's bedside AND on the patient's hospital floor or unit, greater than 50% of which was counseling/coordinating care for f/u after perc Gtube

## 2015-11-17 LAB — COMPREHENSIVE METABOLIC PANEL
ALT: 49 U/L (ref 17–63)
ANION GAP: 8 (ref 5–15)
AST: 38 U/L (ref 15–41)
Albumin: 1.4 g/dL — ABNORMAL LOW (ref 3.5–5.0)
Alkaline Phosphatase: 322 U/L — ABNORMAL HIGH (ref 38–126)
BUN: 28 mg/dL — ABNORMAL HIGH (ref 6–20)
CALCIUM: 7.9 mg/dL — AB (ref 8.9–10.3)
CO2: 29 mmol/L (ref 22–32)
Chloride: 97 mmol/L — ABNORMAL LOW (ref 101–111)
Creatinine, Ser: 2.86 mg/dL — ABNORMAL HIGH (ref 0.61–1.24)
GFR calc non Af Amer: 20 mL/min — ABNORMAL LOW (ref >60)
GFR, EST AFRICAN AMERICAN: 23 mL/min — AB (ref >60)
GLUCOSE: 349 mg/dL — AB (ref 65–99)
POTASSIUM: 3.9 mmol/L (ref 3.5–5.1)
SODIUM: 134 mmol/L — AB (ref 135–145)
TOTAL PROTEIN: 6.5 g/dL (ref 6.5–8.1)
Total Bilirubin: 1.4 mg/dL — ABNORMAL HIGH (ref 0.3–1.2)

## 2015-11-17 LAB — CBC
HCT: 23.6 % — ABNORMAL LOW (ref 39.0–52.0)
Hemoglobin: 7.3 g/dL — ABNORMAL LOW (ref 13.0–17.0)
MCH: 27.8 pg (ref 26.0–34.0)
MCHC: 30.9 g/dL (ref 30.0–36.0)
MCV: 89.7 fL (ref 78.0–100.0)
Platelets: 334 K/uL (ref 150–400)
RBC: 2.63 MIL/uL — ABNORMAL LOW (ref 4.22–5.81)
RDW: 15.4 % (ref 11.5–15.5)
WBC: 16.3 K/uL — ABNORMAL HIGH (ref 4.0–10.5)

## 2015-11-18 ENCOUNTER — Institutional Professional Consult (permissible substitution) (HOSPITAL_COMMUNITY): Payer: Self-pay

## 2015-11-18 LAB — COMPREHENSIVE METABOLIC PANEL
ALBUMIN: 1.3 g/dL — AB (ref 3.5–5.0)
ALK PHOS: 269 U/L — AB (ref 38–126)
ALT: 42 U/L (ref 17–63)
ANION GAP: 9 (ref 5–15)
AST: 36 U/L (ref 15–41)
BUN: 36 mg/dL — ABNORMAL HIGH (ref 6–20)
CALCIUM: 7.9 mg/dL — AB (ref 8.9–10.3)
CHLORIDE: 98 mmol/L — AB (ref 101–111)
CO2: 28 mmol/L (ref 22–32)
Creatinine, Ser: 3.58 mg/dL — ABNORMAL HIGH (ref 0.61–1.24)
GFR calc non Af Amer: 15 mL/min — ABNORMAL LOW (ref >60)
GFR, EST AFRICAN AMERICAN: 18 mL/min — AB (ref >60)
GLUCOSE: 312 mg/dL — AB (ref 65–99)
POTASSIUM: 3.9 mmol/L (ref 3.5–5.1)
SODIUM: 135 mmol/L (ref 135–145)
Total Bilirubin: 1.1 mg/dL (ref 0.3–1.2)
Total Protein: 6.1 g/dL — ABNORMAL LOW (ref 6.5–8.1)

## 2015-11-18 LAB — CBC
HEMATOCRIT: 22.4 % — AB (ref 39.0–52.0)
HEMOGLOBIN: 6.8 g/dL — AB (ref 13.0–17.0)
MCH: 27 pg (ref 26.0–34.0)
MCHC: 30.4 g/dL (ref 30.0–36.0)
MCV: 88.9 fL (ref 78.0–100.0)
Platelets: 336 10*3/uL (ref 150–400)
RBC: 2.52 MIL/uL — AB (ref 4.22–5.81)
RDW: 15.5 % (ref 11.5–15.5)
WBC: 17.7 10*3/uL — ABNORMAL HIGH (ref 4.0–10.5)

## 2015-11-18 LAB — PREPARE RBC (CROSSMATCH)

## 2015-11-18 NOTE — Progress Notes (Signed)
Subjective:  Patient continues to be critically ill. No significant clinical improvement in mental status   Objective:  Vital signs in last 24 hours:  T 97.6 , pulse 92, blood pressure 107/56   Physical Exam: General: Critically ill-appearing  HEENT [place]/AT eyes open NG in place  Neck Trach in place  Pulm/lungs Ventilator dependent, bilateral rhonchi  CVS/Heart Irregular, A Fib  Abdomen:  Soft, nondistended, PEG in place  Extremities: + peripheral edema, RLE amputation   Neurologic: Not following commands, eyes open, doesn't track  Skin: No rashes  Access: Right arm AV graft       Basic Metabolic Panel:   Recent Labs Lab 11/12/15 0753  11/14/15 0910 11/15/15 0657 11/16/15 0535 11/16/15 1805 11/17/15 0540 11/18/15 0530  NA 134*  < > 134* 134* 132* 135 134* 135  K 3.5  < > 3.2* 3.4* 3.7 5.0 3.9 3.9  CL 96*  < > 97* 95* 96* 98* 97* 98*  CO2 27  < > 27 27 26 28 29 28   GLUCOSE 121*  < > 128* 150* 183* 250* 349* 312*  BUN 75*  < > 59* 29* 38* 22* 28* 36*  CREATININE 3.66*  < > 3.29* 2.52* 3.41* 2.35* 2.86* 3.58*  CALCIUM 8.1*  < > 8.2* 8.0* 8.0* 7.9* 7.9* 7.9*  MG  --   --   --   --  2.2  --   --   --   PHOS 3.1  --  3.2  --   --  2.6  --   --   < > = values in this interval not displayed.   CBC:  Recent Labs Lab 11/15/15 0657 11/16/15 0535 11/16/15 1805 11/17/15 0540 11/18/15 0530  WBC 12.9* 13.6* 17.0* 16.3* 17.7*  HGB 7.7* 7.0* 7.7* 7.3* 6.8*  HCT 25.0* 23.1* 25.5* 23.6* 22.4*  MCV 87.4 87.5 88.9 89.7 88.9  PLT 347 366 366 334 336      Microbiology:  Recent Results (from the past 720 hour(s))  Culture, blood (routine x 2)     Status: None   Collection Time: 11/04/15  1:00 PM  Result Value Ref Range Status   Specimen Description BLOOD LEFT ANTECUBITAL  Final   Special Requests BOTTLES DRAWN AEROBIC AND ANAEROBIC 10CC  Final   Culture NO GROWTH 5 DAYS  Final   Report Status 11/09/2015 FINAL  Final  Culture, blood (routine x 2)     Status: None    Collection Time: 11/04/15  1:15 PM  Result Value Ref Range Status   Specimen Description BLOOD LEFT HAND  Final   Special Requests BOTTLES DRAWN AEROBIC AND ANAEROBIC 10CC  Final   Culture NO GROWTH 5 DAYS  Final   Report Status 11/09/2015 FINAL  Final  Culture, blood (routine x 2)     Status: None   Collection Time: 11/05/15  3:00 PM  Result Value Ref Range Status   Specimen Description BLOOD RIGHT ANTECUBITAL  Final   Special Requests BOTTLES DRAWN AEROBIC ONLY 7CC  Final   Culture NO GROWTH 5 DAYS  Final   Report Status 11/10/2015 FINAL  Final  Culture, blood (routine x 2)     Status: None   Collection Time: 11/05/15  3:12 PM  Result Value Ref Range Status   Specimen Description BLOOD RIGHT HAND  Final   Special Requests IN PEDIATRIC BOTTLE 3CC  Final   Culture NO GROWTH 5 DAYS  Final   Report Status 11/10/2015 FINAL  Final  Urine culture     Status: Abnormal   Collection Time: 11/06/15  6:30 AM  Result Value Ref Range Status   Specimen Description URINE, RANDOM  Final   Special Requests NONE  Final   Culture >=100,000 COLONIES/mL YEAST (A)  Final   Report Status 11/07/2015 FINAL  Final    Coagulation Studies: No results for input(s): LABPROT, INR in the last 72 hours.  Urinalysis: No results for input(s): COLORURINE, LABSPEC, PHURINE, GLUCOSEU, HGBUR, BILIRUBINUR, KETONESUR, PROTEINUR, UROBILINOGEN, NITRITE, LEUKOCYTESUR in the last 72 hours.  Invalid input(s): APPERANCEUR    Imaging: Dg Chest Port 1 View  11/18/2015  CLINICAL DATA:  Check tracheostomy placement EXAM: PORTABLE CHEST 1 VIEW COMPARISON:  11/04/2015 FINDINGS: Tracheostomy tube is again identified. Previously seen nasogastric catheter has been removed in the interval. Postsurgical changes are seen. Bilateral pleural effusions are seen. Persistent vascular congestion is noted. IMPRESSION: Tracheostomy in satisfactory position. Stable changes are noted bilaterally. Electronically Signed   By: Alcide Clever  M.D.   On: 11/18/2015 15:04     Medications:       Assessment/ Plan:  75 y.o. 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   1. Acute renal failure. with RUE AVG. Dialysis TTHS. 2. CKD stage IV. 3. Recent PEA arrest with anoxic brain injury. 4. Acute Respiratory failure. 5. Anemia of CKD. 6. Secondary hyperparathyroidism. 7. Seizure disorder noted 10/04/15.  Plan: Last dialysis treatment on Saturday, 1500 cc removed Notified by primary team today the family has decided to discontinue dialysis and patient is being transferred to a nursing home this week. We will therefore sign off Please reconsult as necessary    LOS:  Robert Patrick 6/12/20173:08 PM

## 2015-11-19 LAB — CBC
HCT: 26.3 % — ABNORMAL LOW (ref 39.0–52.0)
HEMOGLOBIN: 8 g/dL — AB (ref 13.0–17.0)
MCH: 27.5 pg (ref 26.0–34.0)
MCHC: 30.4 g/dL (ref 30.0–36.0)
MCV: 90.4 fL (ref 78.0–100.0)
PLATELETS: 347 10*3/uL (ref 150–400)
RBC: 2.91 MIL/uL — AB (ref 4.22–5.81)
RDW: 15.4 % (ref 11.5–15.5)
WBC: 16.8 10*3/uL — ABNORMAL HIGH (ref 4.0–10.5)

## 2015-11-19 LAB — TYPE AND SCREEN
ABO/RH(D): A POS
Antibody Screen: NEGATIVE
Unit division: 0

## 2015-11-19 LAB — COMPREHENSIVE METABOLIC PANEL
ALBUMIN: 1.3 g/dL — AB (ref 3.5–5.0)
ALK PHOS: 296 U/L — AB (ref 38–126)
ALT: 43 U/L (ref 17–63)
AST: 51 U/L — ABNORMAL HIGH (ref 15–41)
Anion gap: 11 (ref 5–15)
BUN: 45 mg/dL — ABNORMAL HIGH (ref 6–20)
CHLORIDE: 97 mmol/L — AB (ref 101–111)
CO2: 28 mmol/L (ref 22–32)
CREATININE: 4.05 mg/dL — AB (ref 0.61–1.24)
Calcium: 8 mg/dL — ABNORMAL LOW (ref 8.9–10.3)
GFR calc non Af Amer: 13 mL/min — ABNORMAL LOW (ref >60)
GFR, EST AFRICAN AMERICAN: 15 mL/min — AB (ref >60)
GLUCOSE: 135 mg/dL — AB (ref 65–99)
Potassium: 4 mmol/L (ref 3.5–5.1)
SODIUM: 136 mmol/L (ref 135–145)
Total Bilirubin: 1.2 mg/dL (ref 0.3–1.2)
Total Protein: 6.2 g/dL — ABNORMAL LOW (ref 6.5–8.1)

## 2015-11-20 LAB — COMPREHENSIVE METABOLIC PANEL
ALT: 45 U/L (ref 17–63)
AST: 43 U/L — ABNORMAL HIGH (ref 15–41)
Albumin: 1.5 g/dL — ABNORMAL LOW (ref 3.5–5.0)
Alkaline Phosphatase: 291 U/L — ABNORMAL HIGH (ref 38–126)
Anion gap: 11 (ref 5–15)
BUN: 57 mg/dL — ABNORMAL HIGH (ref 6–20)
CHLORIDE: 95 mmol/L — AB (ref 101–111)
CO2: 26 mmol/L (ref 22–32)
Calcium: 8.2 mg/dL — ABNORMAL LOW (ref 8.9–10.3)
Creatinine, Ser: 4.87 mg/dL — ABNORMAL HIGH (ref 0.61–1.24)
GFR, EST AFRICAN AMERICAN: 12 mL/min — AB (ref >60)
GFR, EST NON AFRICAN AMERICAN: 11 mL/min — AB (ref >60)
Glucose, Bld: 160 mg/dL — ABNORMAL HIGH (ref 65–99)
POTASSIUM: 4.1 mmol/L (ref 3.5–5.1)
Sodium: 132 mmol/L — ABNORMAL LOW (ref 135–145)
Total Bilirubin: 1.2 mg/dL (ref 0.3–1.2)
Total Protein: 6.4 g/dL — ABNORMAL LOW (ref 6.5–8.1)

## 2015-11-20 LAB — CBC
HEMATOCRIT: 25.6 % — AB (ref 39.0–52.0)
Hemoglobin: 7.8 g/dL — ABNORMAL LOW (ref 13.0–17.0)
MCH: 27.2 pg (ref 26.0–34.0)
MCHC: 30.5 g/dL (ref 30.0–36.0)
MCV: 89.2 fL (ref 78.0–100.0)
PLATELETS: 339 10*3/uL (ref 150–400)
RBC: 2.87 MIL/uL — AB (ref 4.22–5.81)
RDW: 15.8 % — ABNORMAL HIGH (ref 11.5–15.5)
WBC: 16.4 10*3/uL — AB (ref 4.0–10.5)

## 2015-11-21 LAB — CULTURE, RESPIRATORY W GRAM STAIN

## 2015-11-21 LAB — CULTURE, RESPIRATORY

## 2015-12-07 DEATH — deceased

## 2016-10-27 IMAGING — XA IR PERC PLACEMENT GASTROSTOMY
5 series · 5 of 5 positions shown · non-contrast
Comparison: none

INDICATION: CHRONIC VENTILATORY SUPPORT, DIABETES, HYPOXIA RESPIRATORY FAILURE,
ENCEPHALOPATHY, DYSPHAGIA, ASPIRATION

[Series 1: fl (-) angio · 1 of 1 slices shown (1 of 5)]
[im 1/1]
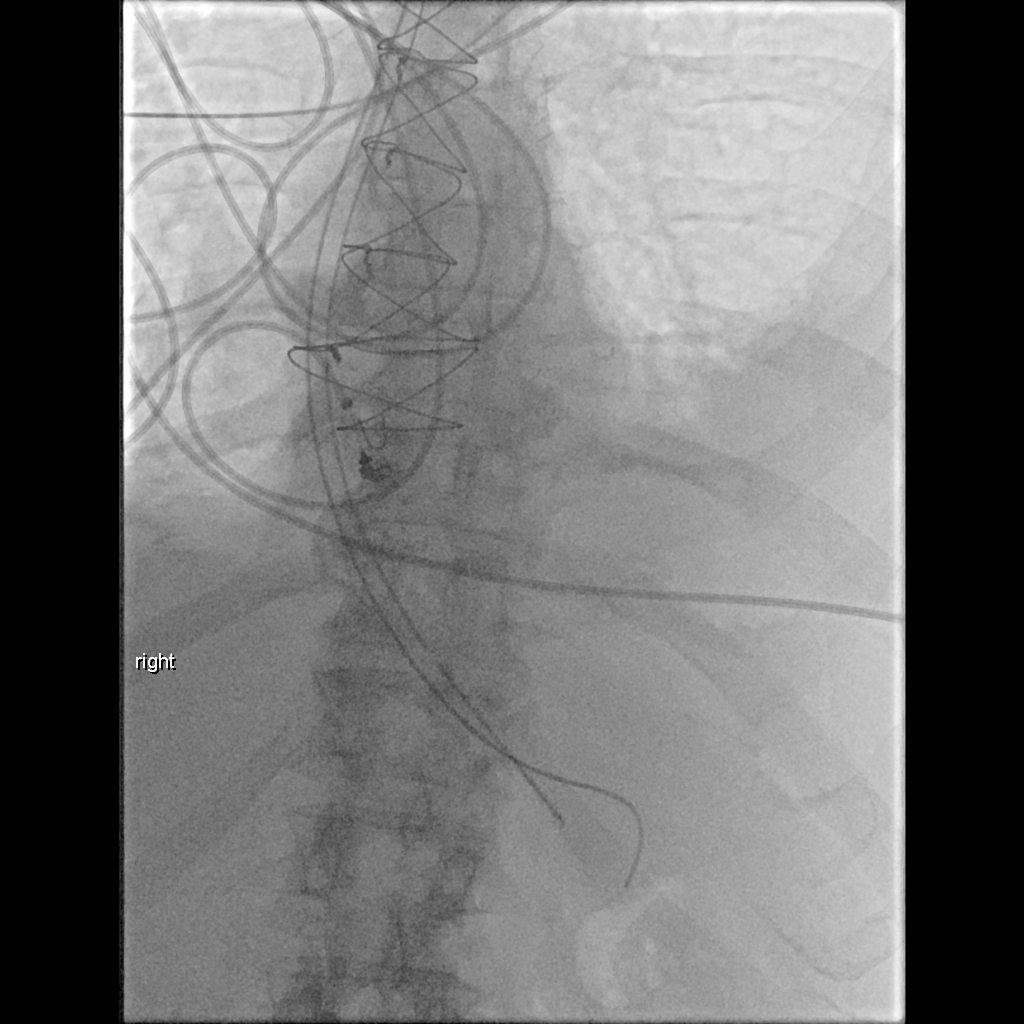

[Series 2: fl (-) angio · 1 of 1 slices shown (2 of 5)]
[im 1/1]
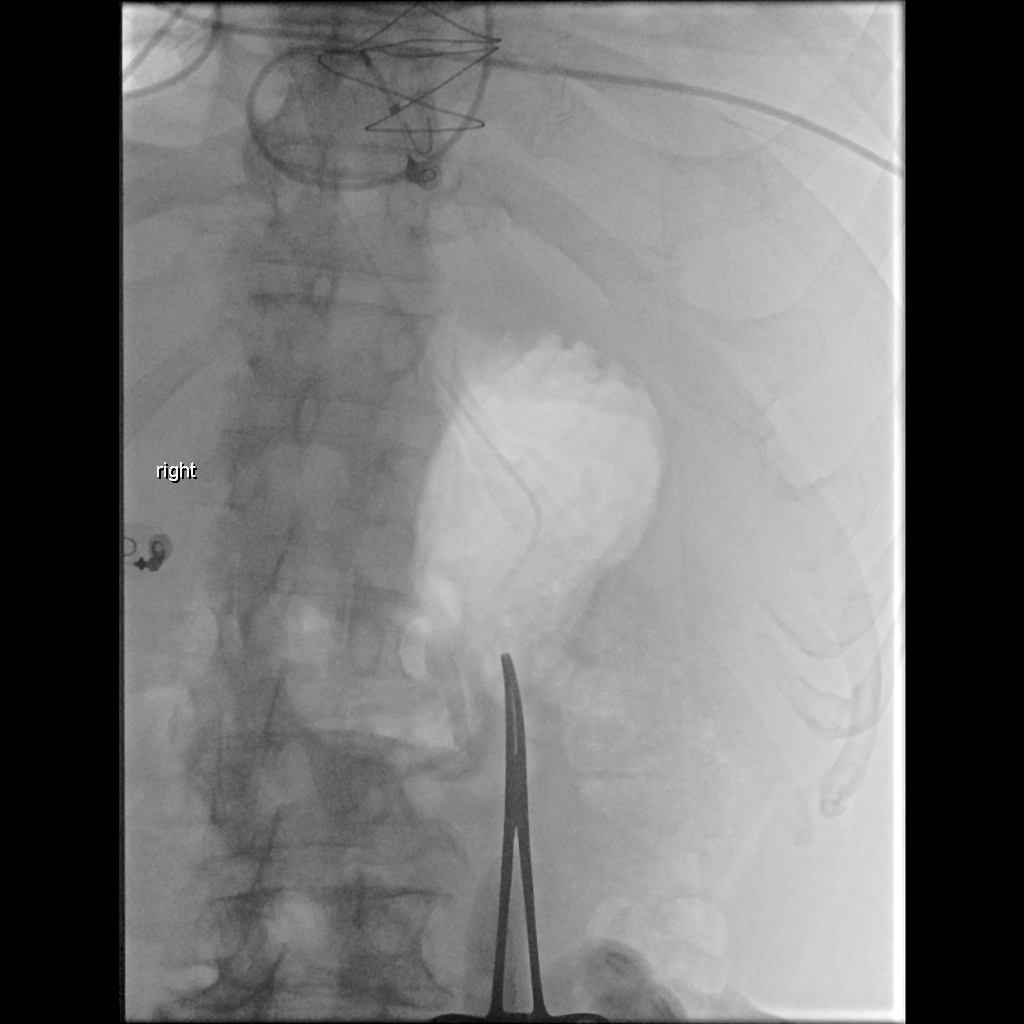

[Series 3: fl (-) angio · 1 of 1 slices shown (3 of 5)]
[im 1/1]
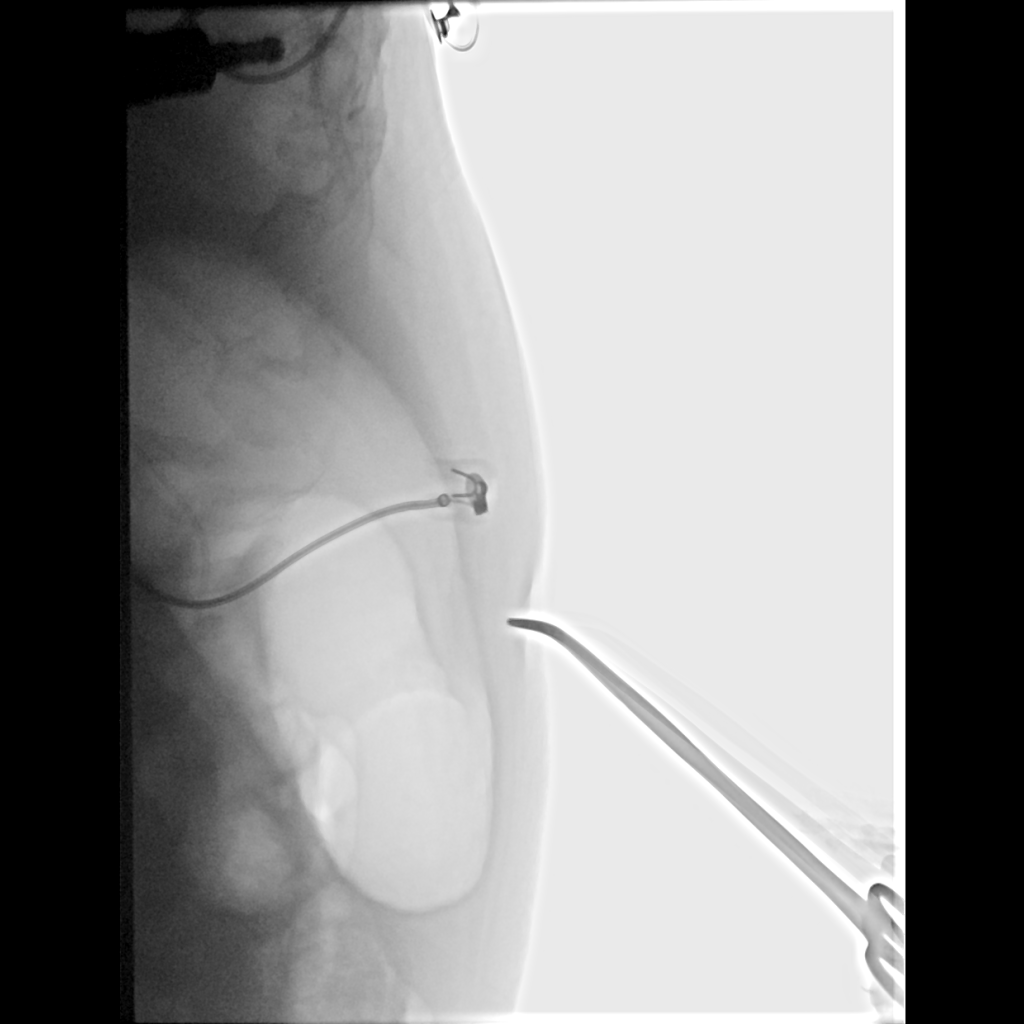

[Series 4: fl (-) angio · 1 of 1 slices shown (4 of 5)]
[im 1/1]
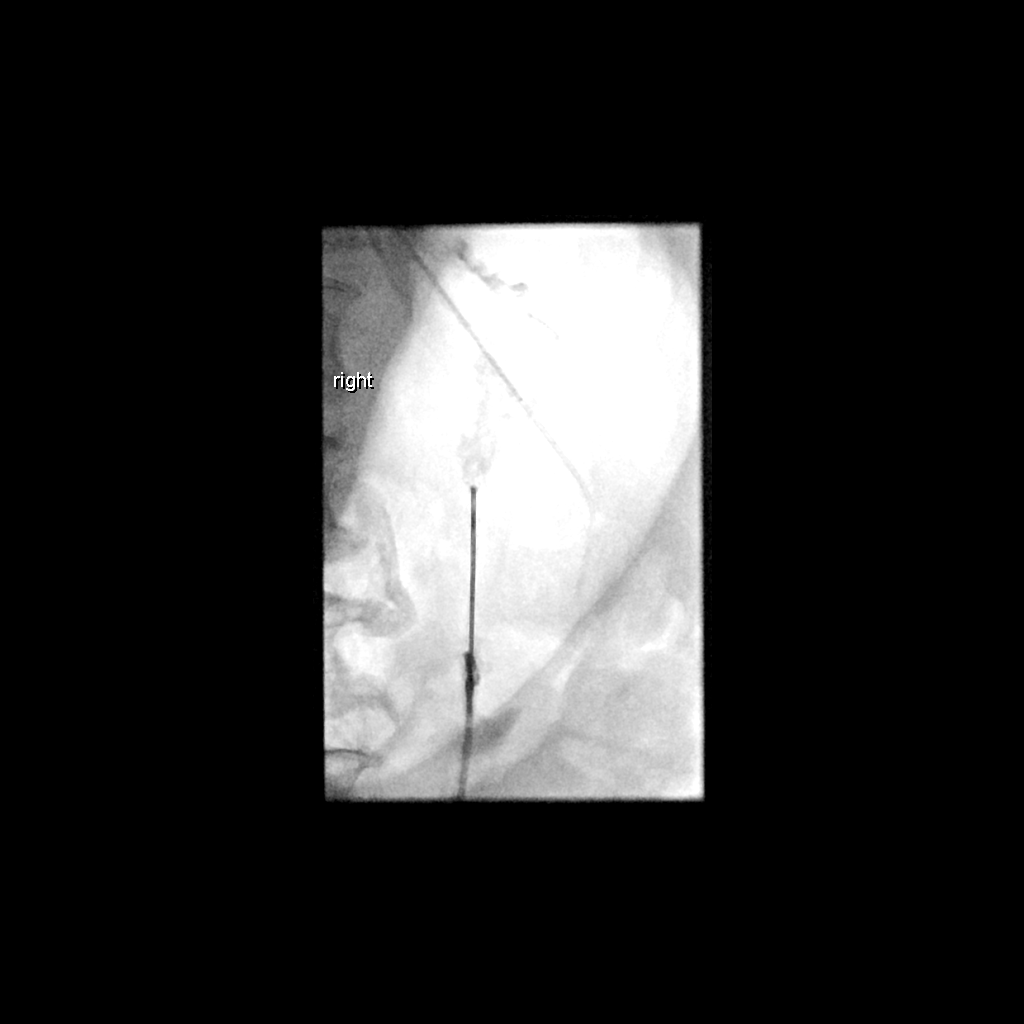

[Series 5: fl (-) angio · 1 of 1 slices shown (5 of 5)]
[im 1/1]
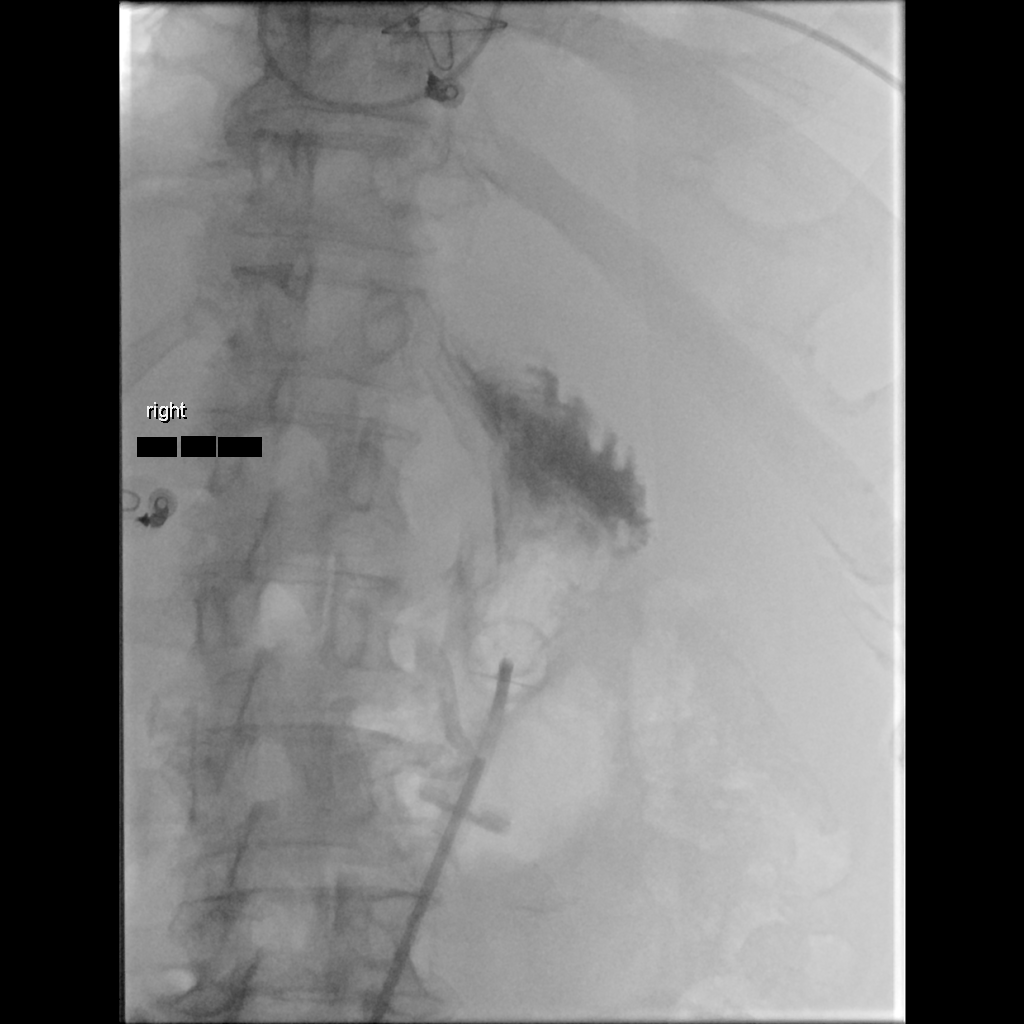

[5 of 5 positions shown; findings below may reference images not displayed]

EXAM:
20 FRENCH PULL-THROUGH GASTROSTOMY

Date:  [DATE] [DATE]

Radiologist:  Hudnall, Guan

Guidance:  Fluoroscopic

MEDICATIONS:
1 g vancomycin; Antibiotics were administered within 1 hour of the
procedure. Glucagon 0.5 mg IV

ANESTHESIA/SEDATION:
Moderate Sedation Time:  None.

The patient was continuously monitored during the procedure by the
interventional radiology nurse under my direct supervision.

CONTRAST:  20 cc Isovue 300  - administered into the gastric lumen.

FLUOROSCOPY TIME:  Fluoroscopy Time: 8 minutes 48 seconds (75 mGy).

COMPLICATIONS:
None immediate.

PROCEDURE:
Informed consent was obtained from the patient following explanation
of the procedure, risks, benefits and alternatives. The patient
understands, agrees and consents for the procedure. All questions
were addressed. A time out was performed.

Maximal barrier sterile technique utilized including caps, mask,
sterile gowns, sterile gloves, large sterile drape, hand hygiene,
and betadine prep.

The left upper quadrant was sterilely prepped and draped. An oral
gastric catheter was inserted into the stomach under fluoroscopy.
The existing nasogastric feeding tube was removed. Air was injected
into the stomach for insufflation and visualization under
fluoroscopy. The air distended stomach was confirmed beneath the
anterior abdominal wall in the frontal and lateral projections.
Under sterile conditions and local anesthesia, a 17 gauge trocar
needle was utilized to access the stomach percutaneously beneath the
left subcostal margin. Needle position was confirmed within the
stomach under biplane fluoroscopy. Contrast injection confirmed
position also. A single T tack was deployed for gastropexy. Over an
Amplatz guide wire, a 9-French sheath was inserted into the stomach.
A snare device was utilized to capture the oral gastric catheter.
The snare device was pulled retrograde from the stomach up the
esophagus and out the oropharynx. The 20-French pull-through
gastrostomy was connected to the snare device and pulled antegrade
through the oropharynx down the esophagus into the stomach and then
through the percutaneous tract external to the patient. The
gastrostomy was assembled externally. Contrast injection confirms
position in the stomach. Images were obtained for documentation. The
patient tolerated procedure well. No immediate complication.
IMPRESSION: Fluoroscopic insertion of a 20-French "pull-through" gastrostomy.

## 7028-02-07 DEATH — deceased
